# Patient Record
Sex: Female | Born: 1935 | Race: White | Hispanic: No | Smoking: Never smoker
Health system: Southern US, Community
[De-identification: ages and names within clinical notes are randomized; demographics above are authoritative.]

## PROBLEM LIST (undated history)

## (undated) DIAGNOSIS — E785 Hyperlipidemia, unspecified: Secondary | ICD-10-CM

## (undated) DIAGNOSIS — I10 Essential (primary) hypertension: Secondary | ICD-10-CM

## (undated) DIAGNOSIS — C801 Malignant (primary) neoplasm, unspecified: Secondary | ICD-10-CM

## (undated) DIAGNOSIS — Z8781 Personal history of (healed) traumatic fracture: Secondary | ICD-10-CM

## (undated) DIAGNOSIS — J40 Bronchitis, not specified as acute or chronic: Secondary | ICD-10-CM

## (undated) DIAGNOSIS — Z87442 Personal history of urinary calculi: Secondary | ICD-10-CM

## (undated) DIAGNOSIS — J449 Chronic obstructive pulmonary disease, unspecified: Secondary | ICD-10-CM

## (undated) DIAGNOSIS — M1990 Unspecified osteoarthritis, unspecified site: Secondary | ICD-10-CM

## (undated) DIAGNOSIS — Z87898 Personal history of other specified conditions: Secondary | ICD-10-CM

## (undated) DIAGNOSIS — R238 Other skin changes: Secondary | ICD-10-CM

## (undated) DIAGNOSIS — K625 Hemorrhage of anus and rectum: Secondary | ICD-10-CM

## (undated) DIAGNOSIS — R233 Spontaneous ecchymoses: Secondary | ICD-10-CM

## (undated) DIAGNOSIS — I639 Cerebral infarction, unspecified: Secondary | ICD-10-CM

## (undated) DIAGNOSIS — Z8719 Personal history of other diseases of the digestive system: Secondary | ICD-10-CM

## (undated) HISTORY — PX: ABDOMINAL HYSTERECTOMY: SHX81

## (undated) HISTORY — PX: EYE SURGERY: SHX253

## (undated) HISTORY — PX: BACK SURGERY: SHX140

## (undated) HISTORY — PX: CATARACT EXTRACTION, BILATERAL: SHX1313

## (undated) HISTORY — PX: ABDOMINAL SURGERY: SHX537

## (undated) HISTORY — PX: RIB FRACTURE SURGERY: SHX2358

## (undated) HISTORY — PX: SKIN BIOPSY: SHX1

---

## 1995-07-05 HISTORY — PX: OTHER SURGICAL HISTORY: SHX169

## 1997-12-16 ENCOUNTER — Encounter: Admission: RE | Admit: 1997-12-16 | Discharge: 1997-12-16 | Payer: Self-pay | Admitting: Sports Medicine

## 1998-02-24 ENCOUNTER — Encounter: Admission: RE | Admit: 1998-02-24 | Discharge: 1998-02-24 | Payer: Self-pay | Admitting: Sports Medicine

## 1998-03-03 ENCOUNTER — Encounter: Admission: RE | Admit: 1998-03-03 | Discharge: 1998-03-03 | Payer: Self-pay | Admitting: Sports Medicine

## 1998-08-03 ENCOUNTER — Encounter: Admission: RE | Admit: 1998-08-03 | Discharge: 1998-09-17 | Payer: Self-pay | Admitting: Family Medicine

## 1999-02-04 ENCOUNTER — Encounter (INDEPENDENT_AMBULATORY_CARE_PROVIDER_SITE_OTHER): Payer: Self-pay | Admitting: Specialist

## 1999-02-04 ENCOUNTER — Ambulatory Visit (HOSPITAL_COMMUNITY): Admission: RE | Admit: 1999-02-04 | Discharge: 1999-02-04 | Payer: Self-pay | Admitting: Gastroenterology

## 2001-02-12 ENCOUNTER — Encounter: Admission: RE | Admit: 2001-02-12 | Discharge: 2001-02-12 | Payer: Self-pay | Admitting: Family Medicine

## 2001-02-12 ENCOUNTER — Encounter: Payer: Self-pay | Admitting: Family Medicine

## 2001-08-16 ENCOUNTER — Encounter: Payer: Self-pay | Admitting: Neurology

## 2001-08-16 ENCOUNTER — Ambulatory Visit (HOSPITAL_COMMUNITY): Admission: RE | Admit: 2001-08-16 | Discharge: 2001-08-16 | Payer: Self-pay | Admitting: Neurology

## 2001-09-04 ENCOUNTER — Encounter: Admission: RE | Admit: 2001-09-04 | Discharge: 2001-09-04 | Payer: Self-pay | Admitting: Family Medicine

## 2001-09-04 ENCOUNTER — Encounter: Payer: Self-pay | Admitting: Family Medicine

## 2004-06-22 ENCOUNTER — Emergency Department (HOSPITAL_COMMUNITY): Admission: EM | Admit: 2004-06-22 | Discharge: 2004-06-22 | Payer: Self-pay | Admitting: Emergency Medicine

## 2006-07-11 ENCOUNTER — Encounter: Admission: RE | Admit: 2006-07-11 | Discharge: 2006-07-11 | Payer: Self-pay | Admitting: Family Medicine

## 2006-10-28 ENCOUNTER — Inpatient Hospital Stay (HOSPITAL_COMMUNITY): Admission: EM | Admit: 2006-10-28 | Discharge: 2006-11-01 | Payer: Self-pay | Admitting: Emergency Medicine

## 2006-11-14 ENCOUNTER — Encounter: Admission: RE | Admit: 2006-11-14 | Discharge: 2006-11-14 | Payer: Self-pay | Admitting: Family Medicine

## 2007-03-02 ENCOUNTER — Ambulatory Visit (HOSPITAL_COMMUNITY): Admission: RE | Admit: 2007-03-02 | Discharge: 2007-03-02 | Payer: Self-pay | Admitting: Gastroenterology

## 2009-02-19 ENCOUNTER — Emergency Department (HOSPITAL_COMMUNITY): Admission: EM | Admit: 2009-02-19 | Discharge: 2009-02-19 | Payer: Self-pay | Admitting: Emergency Medicine

## 2009-09-20 ENCOUNTER — Emergency Department (HOSPITAL_COMMUNITY): Admission: EM | Admit: 2009-09-20 | Discharge: 2009-09-21 | Payer: Self-pay | Admitting: Emergency Medicine

## 2009-09-20 ENCOUNTER — Emergency Department (HOSPITAL_COMMUNITY): Admission: EM | Admit: 2009-09-20 | Discharge: 2009-09-20 | Payer: Self-pay | Admitting: Emergency Medicine

## 2009-10-05 ENCOUNTER — Emergency Department (HOSPITAL_COMMUNITY): Admission: EM | Admit: 2009-10-05 | Discharge: 2009-10-05 | Payer: Self-pay | Admitting: Emergency Medicine

## 2009-11-04 ENCOUNTER — Encounter (INDEPENDENT_AMBULATORY_CARE_PROVIDER_SITE_OTHER): Payer: Self-pay | Admitting: Family Medicine

## 2009-11-04 ENCOUNTER — Ambulatory Visit: Admission: RE | Admit: 2009-11-04 | Discharge: 2009-11-04 | Payer: Self-pay | Admitting: Family Medicine

## 2009-11-04 ENCOUNTER — Ambulatory Visit: Payer: Self-pay | Admitting: Vascular Surgery

## 2010-07-25 ENCOUNTER — Encounter: Payer: Self-pay | Admitting: Gastroenterology

## 2010-08-06 ENCOUNTER — Emergency Department (HOSPITAL_COMMUNITY): Payer: Medicare Other

## 2010-08-06 ENCOUNTER — Inpatient Hospital Stay (HOSPITAL_COMMUNITY)
Admission: EM | Admit: 2010-08-06 | Discharge: 2010-08-10 | DRG: 392 | Disposition: A | Discharge status: Home Health | Payer: Medicare Other | Attending: Internal Medicine | Admitting: Internal Medicine

## 2010-08-06 DIAGNOSIS — R7402 Elevation of levels of lactic acid dehydrogenase (LDH): Secondary | ICD-10-CM | POA: Diagnosis present

## 2010-08-06 DIAGNOSIS — I10 Essential (primary) hypertension: Secondary | ICD-10-CM | POA: Diagnosis present

## 2010-08-06 DIAGNOSIS — A088 Other specified intestinal infections: Principal | ICD-10-CM | POA: Diagnosis present

## 2010-08-06 DIAGNOSIS — E860 Dehydration: Secondary | ICD-10-CM | POA: Diagnosis present

## 2010-08-06 DIAGNOSIS — E876 Hypokalemia: Secondary | ICD-10-CM | POA: Diagnosis present

## 2010-08-06 DIAGNOSIS — Z7982 Long term (current) use of aspirin: Secondary | ICD-10-CM

## 2010-08-06 DIAGNOSIS — Z8673 Personal history of transient ischemic attack (TIA), and cerebral infarction without residual deficits: Secondary | ICD-10-CM

## 2010-08-06 DIAGNOSIS — R7401 Elevation of levels of liver transaminase levels: Secondary | ICD-10-CM | POA: Diagnosis present

## 2010-08-06 DIAGNOSIS — I959 Hypotension, unspecified: Secondary | ICD-10-CM | POA: Diagnosis present

## 2010-08-06 LAB — CK TOTAL AND CKMB (NOT AT ARMC)
Relative Index: INVALID (ref 0.0–2.5)
Total CK: 24 U/L (ref 7–177)

## 2010-08-06 LAB — COMPREHENSIVE METABOLIC PANEL
Albumin: 3.4 g/dL — ABNORMAL LOW (ref 3.5–5.2)
CO2: 23 mEq/L (ref 19–32)
Calcium: 8.8 mg/dL (ref 8.4–10.5)
Chloride: 101 mEq/L (ref 96–112)
GFR calc Af Amer: >60 mL/min (ref >60)
GFR calc non Af Amer: 54 mL/min — ABNORMAL LOW (ref >60)
Glucose, Bld: 188 mg/dL — ABNORMAL HIGH (ref 70–99)
Potassium: 3.4 mEq/L — ABNORMAL LOW (ref 3.5–5.1)
Sodium: 138 mEq/L (ref 135–145)

## 2010-08-06 LAB — DIFFERENTIAL
Basophils Absolute: 0 10*3/uL (ref 0.0–0.1)
Eosinophils Absolute: 0 10*3/uL (ref 0.0–0.7)
Lymphs Abs: 0.7 10*3/uL (ref 0.7–4.0)
Monocytes Absolute: 0.4 10*3/uL (ref 0.1–1.0)
Neutro Abs: 13.7 10*3/uL — ABNORMAL HIGH (ref 1.7–7.7)

## 2010-08-06 LAB — URINALYSIS, ROUTINE W REFLEX MICROSCOPIC
Bilirubin Urine: NEGATIVE
Hgb urine dipstick: NEGATIVE
Nitrite: NEGATIVE
Urine Glucose, Fasting: NEGATIVE mg/dL
pH: 6.5 (ref 5.0–8.0)

## 2010-08-06 LAB — LIPASE, BLOOD: Lipase: 19 U/L (ref 11–59)

## 2010-08-06 LAB — CBC
Hemoglobin: 15.9 g/dL — ABNORMAL HIGH (ref 12.0–15.0)
MCH: 30.7 pg (ref 26.0–34.0)
Platelets: 273 10*3/uL (ref 150–400)
RDW: 13.7 % (ref 11.5–15.5)

## 2010-08-07 ENCOUNTER — Inpatient Hospital Stay (HOSPITAL_COMMUNITY): Payer: Medicare Other

## 2010-08-07 LAB — CARDIAC PANEL(CRET KIN+CKTOT+MB+TROPI)
CK, MB: 4.8 ng/mL — ABNORMAL HIGH (ref 0.3–4.0)
Relative Index: 0.8 (ref 0.0–2.5)
Total CK: 633 U/L — ABNORMAL HIGH (ref 7–177)
Troponin I: 0.01 ng/mL (ref 0.00–0.06)

## 2010-08-07 LAB — HEMOGLOBIN A1C
Hgb A1c MFr Bld: 5.8 % — ABNORMAL HIGH (ref <5.7)
Mean Plasma Glucose: 120 mg/dL — ABNORMAL HIGH (ref <117)

## 2010-08-07 LAB — COMPREHENSIVE METABOLIC PANEL
AST: 67 U/L — ABNORMAL HIGH (ref 0–37)
Albumin: 2.7 g/dL — ABNORMAL LOW (ref 3.5–5.2)
CO2: 24 mEq/L (ref 19–32)
Calcium: 8.4 mg/dL (ref 8.4–10.5)
Creatinine, Ser: 1.17 mg/dL (ref 0.4–1.2)
GFR calc Af Amer: 53 mL/min — ABNORMAL LOW (ref >60)
GFR calc non Af Amer: 44 mL/min — ABNORMAL LOW (ref >60)
Total Protein: 5.5 g/dL — ABNORMAL LOW (ref 6.0–8.3)

## 2010-08-07 LAB — URINE CULTURE: Culture  Setup Time: 201202031516

## 2010-08-08 LAB — BASIC METABOLIC PANEL
GFR calc Af Amer: 50 mL/min — ABNORMAL LOW (ref >60)
GFR calc non Af Amer: 42 mL/min — ABNORMAL LOW (ref >60)
Potassium: 3.7 mEq/L (ref 3.5–5.1)
Sodium: 143 mEq/L (ref 135–145)

## 2010-08-08 LAB — CBC
HCT: 39.8 % (ref 36.0–46.0)
Platelets: 230 10*3/uL (ref 150–400)
RDW: 14.1 % (ref 11.5–15.5)
WBC: 10.6 10*3/uL — ABNORMAL HIGH (ref 4.0–10.5)

## 2010-08-08 LAB — HEPATITIS PANEL, ACUTE
Hep A IgM: NEGATIVE
Hep B C IgM: NEGATIVE
Hepatitis B Surface Ag: NEGATIVE

## 2010-08-09 DIAGNOSIS — R55 Syncope and collapse: Secondary | ICD-10-CM

## 2010-08-09 LAB — CBC
HCT: 36.8 % (ref 36.0–46.0)
MCV: 87.8 fL (ref 78.0–100.0)
Platelets: 239 10*3/uL (ref 150–400)
RBC: 4.19 MIL/uL (ref 3.87–5.11)
WBC: 7.5 10*3/uL (ref 4.0–10.5)

## 2010-08-09 LAB — DIFFERENTIAL
Lymphocytes Relative: 38 % (ref 12–46)
Lymphs Abs: 2.8 10*3/uL (ref 0.7–4.0)
Neutro Abs: 3.8 10*3/uL (ref 1.7–7.7)
Neutrophils Relative %: 51 % (ref 43–77)

## 2010-08-09 LAB — BASIC METABOLIC PANEL
BUN: 10 mg/dL (ref 6–23)
Chloride: 107 mEq/L (ref 96–112)
Glucose, Bld: 95 mg/dL (ref 70–99)
Potassium: 3.7 mEq/L (ref 3.5–5.1)
Sodium: 143 mEq/L (ref 135–145)

## 2010-08-09 LAB — MAGNESIUM: Magnesium: 1.5 mg/dL (ref 1.5–2.5)

## 2010-08-10 LAB — CBC
HCT: 37.7 % (ref 36.0–46.0)
Hemoglobin: 13.1 g/dL (ref 12.0–15.0)
MCHC: 34.7 g/dL (ref 30.0–36.0)
RBC: 4.35 MIL/uL (ref 3.87–5.11)

## 2010-08-10 LAB — COMPREHENSIVE METABOLIC PANEL
ALT: 28 U/L (ref 0–35)
Alkaline Phosphatase: 53 U/L (ref 39–117)
CO2: 26 mEq/L (ref 19–32)
Calcium: 8.3 mg/dL — ABNORMAL LOW (ref 8.4–10.5)
Chloride: 106 mEq/L (ref 96–112)
GFR calc non Af Amer: >60 mL/min (ref >60)
Glucose, Bld: 109 mg/dL — ABNORMAL HIGH (ref 70–99)
Sodium: 140 mEq/L (ref 135–145)
Total Bilirubin: 1.1 mg/dL (ref 0.3–1.2)

## 2010-08-12 LAB — CULTURE, BLOOD (ROUTINE X 2): Culture: NO GROWTH

## 2010-08-15 NOTE — H&P (Signed)
NAMESHAVONDA, WIEDMAN                  ACCOUNT NO.:  [id number]  MEDICAL RECORD NO.:  [id number]           PATIENT TYPE:  E  LOCATION:  [facility]                         FACILITY:  [facility]  PHYSICIAN:  Michiel Cowboy, MDDATE OF BIRTH:  [birth year]  DATE OF ADMISSION:  08/06/2010 DATE OF DISCHARGE:                             HISTORY & PHYSICAL   ATTENDING PHYSICIAN:  Michiel Cowboy, MD  PRIMARY CARE PROVIDER:  Joycelyn Rua, MD  CHIEF COMPLAINT:  Per ED notes, syncope.  The patient is an 77 year old female, unable to get much of a history from her whatsoever.  She is nonverbal at this point, I do not know what her baseline is.  Her son apparently drops her off in emergency department after episodes of syncope while in the toilet with nausea and vomiting for the past 24 hours.  She also has had some altered mental status and deemed confused.  She had low grade fever.  Family member currently left and I am unable to get hold of them, their phone numbers are disconnected and no one is answering.  PAST MEDICAL HISTORY:  Per emergency department significant for hypertension and stroke.  SOCIAL HISTORY:  Unable to obtain.  FAMILY HISTORY:  Unable to obtain.  REVIEW OF SYSTEMS:  Unable to obtain as the patient is nonverbal.  ALLERGIES:  Per computer, IRON and BANANAS  MEDICATIONS:  Unable to obtain.  Vitals, temperature 100.6 rectally.  Blood pressure 119/48, but couple values down to 103/79, pulse 87, respirations 22, satting 98% on 2 L. The patient appears to be in no acute distress.  Head; nontraumatic, very dry mucous membranes and decreased skin turgor.  The patient is alert but not oriented, not answering any questions, not making eye contact.  Not following commands.  Heart, regular rate and rhythm.  No murmurs appreciated.  Abdomen is soft.  The patient did grimace when I touched her upper abdomen, I am not sure if it is the pain related or not.  Lower extremities  without clubbing, cyanosis or edema.  Neurologically unable to fully evaluate as the patient is not cooperative.  Labs; white blood cell count 14.9, hemoglobin 15.9, sodium 148, potassium 3.4, creatinine 0.99, AST 71, ALT 57, albumin 3.4.  The patient has slightly elevated AST and ALT.  Cardiac enzymes negative.  UA is unremarkable.  Chest x-ray is unremarkable.  Lactic acid is 3.0.  Lipase normal at 19.  EKG showing sinus heart rate of 92.  No ischemic changes could be appreciated.  ASSESSMENT AND PLAN:  This is an 77 year old female with reported nausea, vomiting, possible syncopal episode and low grade fevers and low blood pressure which is currently improved after IV fluids, been admitted for further evaluation. 1. Nausea, vomiting, unclear if she has maybe an abdominal discomfort     and low grade fever.  Low grade fever, nausea and vomiting.  I     wonder if she has some kind of intraabdominal process going on.  No     abdominal imaging was done.  The patient is completely nonverbal     and unable to  provide any history to me though get a KUB and right     upper quadrant ultrasound to evaluate gallbladder and liver.  Given     the low grade fever, white blood cell count and slightly elevated     LFTs, although this could be death secondary to dehydration but I     do not have any other source of infection.  Secondary to low grade     fever, hypertension and presentation of a white blood cell count,     unclear source for [place] now to cover broadly with Zosyn.  Diarrhea.  We will check for C. Diff.  Low potassium replacement secondary to diarrhea.  We will monitor on telemetry.  History of hypertension.  I am not sure what she usually takes at her baseline right now.  We will hold on all antibiotics.  Elevated glucose.  We will check hemoglobin A1c.  Prophylaxis.  Protonix and SCDs.  Altered mental status.  We will get a CT scan of the head to start with.  Code status,  unknown.  Unable to get in contact with any of her family.  Debility.  The patient is very weak.  We will get PT/OT evaluation, prior to that we will get social worker as well to see if she might need placement and also to see if we can somehow getting a hold of her family for her.     Michiel Cowboy, MD     AVD/MEDQ  D:  08/06/2010  T:  08/06/2010  Job:  [id number]  cc:   Joycelyn Rua, M.D.  Electronically Signed by Therisa Doyne MD on 08/14/2010 47:82:95 PM

## 2010-08-15 NOTE — Discharge Summary (Signed)
Stephanie Bruce, Stephanie Bruce                  ACCOUNT NO.:  [id number]  MEDICAL RECORD NO.:  [id number]           PATIENT TYPE:  I  LOCATION:  4730                         FACILITY:  [facility]  PHYSICIAN:  Hartley Barefoot, MD    DATE OF BIRTH:  May 11, 1925  DATE OF ADMISSION:  08/06/2010 DATE OF DISCHARGE:  08/10/2010                              DISCHARGE SUMMARY   DISCHARGE DIAGNOSES: 1. Syncope event likely secondary to hypotension. 2. Hypotension secondary to dehydration. 3. Viral gastroenteritis. 4. Transaminases, resolved.  OTHER PAST MEDICAL HISTORY: 1. Hypertension. 2. History of stroke.  DISCHARGE MEDICATIONS: 1. Protonix 40 mg p.o. daily. 2. Potassium chloride 30 mEq 1 tablet by mouth daily for 5 days. 3. Aspirin 81 mg 1 tablet by mouth daily. 4. Atenolol hydrochlorothiazide was stopped.  DISPOSITION AND FOLLOWUP:  Ms. Stephanie Bruce will need to follow up her primary care physician with 1 or 2 weeks to consider restart her on her bloodpressure medications.  STUDIES PERFORMED: 1. A 2-D echo, systolic function was normal, ejection fraction of 55-     60%.  No wall motion abnormality. 2. Bilateral carotid Doppler, no carotid occlusion. 3. Ultrasound of abdomen, no acute abdominal process. 4. CT of head, small vessel ischemic disease and brain atrophy.  No     acute finding noted.  BRIEF HISTORY OF PRESENT ILLNESS:  This is an 77 year old that presented to the emergency department after having a syncope event.  Per notes, the patient had a syncope while in the toilet when she was having nausea and vomiting.  She has been having nausea and vomiting for the last 24 hours.  She has had also diarrhea.  She has sick contact.  Her sons were sick last week with the same symptoms.  HOSPITAL COURSE: 1. Viral gastroenteritis.  The patient presented with nausea,     vomiting, and diarrhea.  This is likely secondary to viral versus     bacterial gastroenteritis.  She has sick contact, her sons.    Both of them were having diarrhea last week.  The patient was     initially treated with IV fluid and Zosyn.  Zosyn was stopped on     the second day of hospitalization.  White blood cell remained     stable.  Diarrhea has resolved.  She had one loose stool on the     date of discharge.  C. diff and PCR was negative.  Blood cultures     were negative.  Abdominal ultrasound was negative. 2. Transaminases.  This is transient.  Viral hepatitis panel was     negative.  Ultrasound was normal.  Liver function tests normalized     on the day of discharge, AST 37, ALT 28. 3. Syncope likely secondary to orthostatic hypotension Vs      vasovagal.  The patient responded to IV fluid.  A CT head was     negative.  Bilateral carotid artery preliminary result, no ICA     stenosis bilaterally.  A 2-D echo, no aortic stenosis. 4. Hypertension.  We are going to hold her blood pressure medications.  She will need to follow up with her primary care physician to     consider restart her on her blood pressure medications.  The     patient will be discharged off blood pressure medications. 5. Leukocytosis, likely stress-related secondary to viral/bacterial     gastroenteritis.  White blood cells are going down.  The patient     was on 1 day of IV Zosyn.  After stopping antibiotics, the patient     was observed, and white blood cell was continued to go down. 6. On the day of discharge, the patient was in improved condition.     She was tolerating diet.  She was eating and drinking.  Diarrhea     has improved.  She only had one formed loose stool the day of     discharge.  Blood pressure 120/70, sats 96 on room, pulse 72,     respirations 16, temperature 98.3.  White blood cells 7.9,     hemoglobin 13, hematocrit 37, platelet 300.  BUN 3, creatinine     0.77, glucose 109, sodium 140, potassium 3.1.  This will be     repeated prior to discharge, chloride 106, bicarb 26.  The patient     was discharged in  improved condition.  Of note, the patient's sons     want to take her home.  They said they are able to provide the care     that her mother needs.     Hartley Barefoot, MD     BR/MEDQ  D:  08/10/2010  T:  08/11/2010  Job:  [id number]  Electronically Signed by Hartley Barefoot MD on 08/15/2010 10:26:47 PM

## 2010-09-22 LAB — URINE CULTURE: Colony Count: >=100000

## 2010-09-22 LAB — URINALYSIS, ROUTINE W REFLEX MICROSCOPIC
Bilirubin Urine: NEGATIVE
Ketones, ur: NEGATIVE mg/dL
Nitrite: POSITIVE — AB
Protein, ur: NEGATIVE mg/dL
Urobilinogen, UA: 1 mg/dL (ref 0.0–1.0)
pH: 6 (ref 5.0–8.0)

## 2010-09-22 LAB — BASIC METABOLIC PANEL
Chloride: 98 mEq/L (ref 96–112)
Creatinine, Ser: 0.86 mg/dL (ref 0.4–1.2)
GFR calc Af Amer: >60 mL/min (ref >60)
GFR calc non Af Amer: >60 mL/min (ref >60)

## 2010-09-22 LAB — DIFFERENTIAL
Eosinophils Absolute: 0.2 10*3/uL (ref 0.0–0.7)
Lymphocytes Relative: 36 % (ref 12–46)
Lymphs Abs: 3.6 10*3/uL (ref 0.7–4.0)
Monocytes Relative: 9 % (ref 3–12)
Neutro Abs: 5.2 10*3/uL (ref 1.7–7.7)
Neutrophils Relative %: 52 % (ref 43–77)

## 2010-09-22 LAB — CBC
MCV: 92.7 fL (ref 78.0–100.0)
RBC: 4.52 MIL/uL (ref 3.87–5.11)
WBC: 10 10*3/uL (ref 4.0–10.5)

## 2010-09-22 LAB — URINE MICROSCOPIC-ADD ON

## 2010-09-26 LAB — CBC
HCT: 43.4 % (ref 36.0–46.0)
Hemoglobin: 14.7 g/dL (ref 12.0–15.0)
MCHC: 33.9 g/dL (ref 30.0–36.0)
MCV: 93.3 fL (ref 78.0–100.0)
Platelets: 295 10*3/uL (ref 150–400)
RBC: 4.65 MIL/uL (ref 3.87–5.11)
RDW: 14 % (ref 11.5–15.5)
WBC: 13.4 10*3/uL — ABNORMAL HIGH (ref 4.0–10.5)

## 2010-09-26 LAB — BASIC METABOLIC PANEL WITH GFR
CO2: 31 meq/L (ref 19–32)
Chloride: 98 meq/L (ref 96–112)
GFR calc Af Amer: >60 mL/min (ref >60)
Potassium: 3.1 meq/L — ABNORMAL LOW (ref 3.5–5.1)
Sodium: 137 meq/L (ref 135–145)

## 2010-09-26 LAB — DIFFERENTIAL
Basophils Absolute: 0.1 10*3/uL (ref 0.0–0.1)
Basophils Relative: 0 % (ref 0–1)
Eosinophils Absolute: 0.1 10*3/uL (ref 0.0–0.7)
Eosinophils Relative: 1 % (ref 0–5)
Lymphocytes Relative: 32 % (ref 12–46)
Lymphs Abs: 4.2 10*3/uL — ABNORMAL HIGH (ref 0.7–4.0)
Monocytes Absolute: 1.1 K/uL — ABNORMAL HIGH (ref 0.1–1.0)
Monocytes Relative: 8 % (ref 3–12)
Neutro Abs: 7.9 K/uL — ABNORMAL HIGH (ref 1.7–7.7)
Neutrophils Relative %: 59 % (ref 43–77)

## 2010-09-26 LAB — URINALYSIS, ROUTINE W REFLEX MICROSCOPIC
Bilirubin Urine: NEGATIVE
Glucose, UA: NEGATIVE mg/dL
Hgb urine dipstick: NEGATIVE
Ketones, ur: NEGATIVE mg/dL
Nitrite: NEGATIVE
Protein, ur: NEGATIVE mg/dL
Specific Gravity, Urine: 1.008 (ref 1.005–1.030)
Urobilinogen, UA: 1 mg/dL (ref 0.0–1.0)
pH: 7 (ref 5.0–8.0)

## 2010-09-26 LAB — URINE CULTURE
Colony Count: NO GROWTH
Culture: NO GROWTH

## 2010-09-26 LAB — POCT CARDIAC MARKERS
Myoglobin, poc: 75.8 ng/mL (ref 12–200)
Troponin i, poc: <0.05 ng/mL (ref 0.00–0.09)

## 2010-09-26 LAB — BASIC METABOLIC PANEL
BUN: 7 mg/dL (ref 6–23)
Calcium: 9.1 mg/dL (ref 8.4–10.5)
Creatinine, Ser: 0.82 mg/dL (ref 0.4–1.2)
GFR calc non Af Amer: >60 mL/min (ref >60)
Glucose, Bld: 102 mg/dL — ABNORMAL HIGH (ref 70–99)

## 2010-10-09 LAB — CBC
HCT: 42.1 % (ref 36.0–46.0)
MCHC: 34.2 g/dL (ref 30.0–36.0)
MCV: 94.1 fL (ref 78.0–100.0)
Platelets: 261 10*3/uL (ref 150–400)
RDW: 14.1 % (ref 11.5–15.5)
WBC: 11.6 10*3/uL — ABNORMAL HIGH (ref 4.0–10.5)

## 2010-10-09 LAB — URINE CULTURE

## 2010-10-09 LAB — BASIC METABOLIC PANEL
BUN: 17 mg/dL (ref 6–23)
CO2: 31 mEq/L (ref 19–32)
Chloride: 96 mEq/L (ref 96–112)
Creatinine, Ser: 0.85 mg/dL (ref 0.4–1.2)
Glucose, Bld: 111 mg/dL — ABNORMAL HIGH (ref 70–99)
Potassium: 3.4 mEq/L — ABNORMAL LOW (ref 3.5–5.1)

## 2010-10-09 LAB — URINALYSIS, ROUTINE W REFLEX MICROSCOPIC
Nitrite: NEGATIVE
Protein, ur: NEGATIVE mg/dL
Specific Gravity, Urine: 1.021 (ref 1.005–1.030)
Urobilinogen, UA: 0.2 mg/dL (ref 0.0–1.0)

## 2010-10-09 LAB — DIFFERENTIAL
Basophils Relative: 1 % (ref 0–1)
Eosinophils Absolute: 0.1 10*3/uL (ref 0.0–0.7)
Eosinophils Relative: 1 % (ref 0–5)
Lymphs Abs: 4.1 10*3/uL — ABNORMAL HIGH (ref 0.7–4.0)

## 2010-10-09 LAB — PROTIME-INR: Prothrombin Time: 12.5 seconds (ref 11.6–15.2)

## 2010-11-16 NOTE — Discharge Summary (Signed)
NAMENERINE, PULSE                  ACCOUNT NO.:  [id number]   MEDICAL RECORD NO.:  [id number]          PATIENT TYPE:  INP   LOCATION:  6735                         FACILITY:  [facility]   PHYSICIAN:  Andres Shad. Rudean Curt, MD     DATE OF BIRTH:  Nov 28, 1924   DATE OF ADMISSION:  10/28/2006  DATE OF DISCHARGE:  11/01/2006                               DISCHARGE SUMMARY   DISCHARGE DIAGNOSES:  1. Dehydration.  2. Mental status changes.  3. Syncopal episode.  4. Abdominal pain with vomiting.  5. Constipation.   DISCHARGE MEDICATIONS:  1. MiraLax.  2. Atenolol.  3. Evista.  4. Lipitor.  5. Meloxicam.   SUMMARY OF HOSPITALIZATION:  Ms. Buchberger is a 77 year old female with a  past medical history of hypertension who was admitted to the hospital  with a syncopal episode on October 28, 2006.  She had nausea and vomiting  on the day of admission and this was followed by an episode of syncope.  When she came to, she was quite confused and was initially unable to  speak.  She was brought to the emergency department where she gradually  seemed more alert, but was still vomiting.  Initial clinical evaluation  was notable for clinical dehydration.  Radiology studies were performed  including CT scan of the head.  This was remarkable only for atrophy.  PA and lateral views of the chest revealed emphysema with a question of  a nodular density in the left lung base.  CT scan of the abdomen and  pelvis was notable only for diverticulosis and evidence of constipation.   The patient was admitted to the hospital where she received antiemetics,  IV hydration and electrolyte replacement.  She was felt to be quite  cachectic and she needed substantial doses of potassium as well as other  electrolytes  for these to remain within normal limits.  Patient's vital  signs remained stable and she gradually improved in terms of alertness  and orientation over the subsequent few days.  An MRI of the head was  attempted but  the patient was unable to tolerate this exam so it was  decided that an MRI  under sedation was not worth the risk especially in  light of her already altered mental status.   At the time of discharge, the patient's family felt comfortable taking  her home and following up with Dr. Lenise Arena later this week.   FINAL ASSESSMENT AND PLAN:  1. Mental status changes.  This is most likely due to hypovolemia and      electrolyte depletion, this is largely resolved.  The patient      should follow up with her primary care physician within the next      few days and have a repeat set of electrolytes to ensure that they      have remained normal.  2. Vomiting and dehydration.  I have encouraged the patient's sons to      provide a normal diet to her and encourage both caloric and fluid      intake.  3.  Constipation.  I have prescribed the patient MiraLax to be taken as      an outpatient.  She has received a dose in the hospital.  4. Nodular densities seen in the left lung.  Follow up imaging of the      chest with either a repeat PA and lateral chest x-ray or a CT scan      will be useful when the patient has recovered from this acute      illness and is able to participate in such an examination.      Andres Shad. Rudean Curt, MD  Electronically Signed     PML/MEDQ  D:  11/01/2006  T:  11/01/2006  Job:  [id number]   cc:   Joycelyn Rua, M.D.
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
## 2011-05-16 ENCOUNTER — Encounter: Payer: Self-pay | Admitting: *Deleted

## 2011-05-16 ENCOUNTER — Inpatient Hospital Stay (HOSPITAL_COMMUNITY): Payer: Medicare Other

## 2011-05-16 ENCOUNTER — Emergency Department (HOSPITAL_COMMUNITY): Payer: Medicare Other

## 2011-05-16 ENCOUNTER — Other Ambulatory Visit: Payer: Self-pay

## 2011-05-16 ENCOUNTER — Inpatient Hospital Stay (HOSPITAL_COMMUNITY)
Admission: EM | Admit: 2011-05-16 | Discharge: 2011-05-23 | DRG: 871 | Disposition: A | Discharge status: Home Health | Payer: Medicare Other | Attending: Family Medicine | Admitting: Family Medicine

## 2011-05-16 DIAGNOSIS — A419 Sepsis, unspecified organism: Secondary | ICD-10-CM | POA: Diagnosis present

## 2011-05-16 DIAGNOSIS — E785 Hyperlipidemia, unspecified: Secondary | ICD-10-CM | POA: Diagnosis present

## 2011-05-16 DIAGNOSIS — Z8673 Personal history of transient ischemic attack (TIA), and cerebral infarction without residual deficits: Secondary | ICD-10-CM

## 2011-05-16 DIAGNOSIS — R55 Syncope and collapse: Secondary | ICD-10-CM | POA: Diagnosis present

## 2011-05-16 DIAGNOSIS — I959 Hypotension, unspecified: Secondary | ICD-10-CM

## 2011-05-16 DIAGNOSIS — E162 Hypoglycemia, unspecified: Secondary | ICD-10-CM | POA: Diagnosis not present

## 2011-05-16 DIAGNOSIS — R578 Other shock: Secondary | ICD-10-CM | POA: Diagnosis present

## 2011-05-16 DIAGNOSIS — N179 Acute kidney failure, unspecified: Secondary | ICD-10-CM | POA: Diagnosis present

## 2011-05-16 DIAGNOSIS — D72829 Elevated white blood cell count, unspecified: Secondary | ICD-10-CM | POA: Diagnosis present

## 2011-05-16 DIAGNOSIS — J4489 Other specified chronic obstructive pulmonary disease: Secondary | ICD-10-CM | POA: Diagnosis present

## 2011-05-16 DIAGNOSIS — A4151 Sepsis due to Escherichia coli [E. coli]: Principal | ICD-10-CM | POA: Diagnosis present

## 2011-05-16 DIAGNOSIS — J449 Chronic obstructive pulmonary disease, unspecified: Secondary | ICD-10-CM | POA: Diagnosis present

## 2011-05-16 DIAGNOSIS — M129 Arthropathy, unspecified: Secondary | ICD-10-CM | POA: Diagnosis present

## 2011-05-16 DIAGNOSIS — R197 Diarrhea, unspecified: Secondary | ICD-10-CM | POA: Diagnosis not present

## 2011-05-16 DIAGNOSIS — F0390 Unspecified dementia without behavioral disturbance: Secondary | ICD-10-CM | POA: Diagnosis present

## 2011-05-16 DIAGNOSIS — E870 Hyperosmolality and hypernatremia: Secondary | ICD-10-CM | POA: Diagnosis present

## 2011-05-16 DIAGNOSIS — D696 Thrombocytopenia, unspecified: Secondary | ICD-10-CM | POA: Diagnosis not present

## 2011-05-16 DIAGNOSIS — I509 Heart failure, unspecified: Secondary | ICD-10-CM

## 2011-05-16 DIAGNOSIS — I129 Hypertensive chronic kidney disease with stage 1 through stage 4 chronic kidney disease, or unspecified chronic kidney disease: Secondary | ICD-10-CM | POA: Diagnosis present

## 2011-05-16 DIAGNOSIS — E878 Other disorders of electrolyte and fluid balance, not elsewhere classified: Secondary | ICD-10-CM | POA: Diagnosis present

## 2011-05-16 DIAGNOSIS — E861 Hypovolemia: Secondary | ICD-10-CM | POA: Diagnosis present

## 2011-05-16 DIAGNOSIS — N390 Urinary tract infection, site not specified: Secondary | ICD-10-CM | POA: Diagnosis present

## 2011-05-16 DIAGNOSIS — F068 Other specified mental disorders due to known physiological condition: Secondary | ICD-10-CM | POA: Diagnosis present

## 2011-05-16 DIAGNOSIS — N182 Chronic kidney disease, stage 2 (mild): Secondary | ICD-10-CM | POA: Diagnosis present

## 2011-05-16 HISTORY — DX: Personal history of other diseases of the digestive system: Z87.19

## 2011-05-16 HISTORY — DX: Personal history of other specified conditions: Z87.898

## 2011-05-16 HISTORY — DX: Personal history of (healed) traumatic fracture: Z87.81

## 2011-05-16 HISTORY — DX: Cerebral infarction, unspecified: I63.9

## 2011-05-16 HISTORY — DX: Spontaneous ecchymoses: R23.3

## 2011-05-16 HISTORY — DX: Bronchitis, not specified as acute or chronic: J40

## 2011-05-16 HISTORY — DX: Chronic obstructive pulmonary disease, unspecified: J44.9

## 2011-05-16 HISTORY — DX: Hyperlipidemia, unspecified: E78.5

## 2011-05-16 HISTORY — DX: Malignant (primary) neoplasm, unspecified: C80.1

## 2011-05-16 HISTORY — DX: Essential (primary) hypertension: I10

## 2011-05-16 HISTORY — DX: Unspecified osteoarthritis, unspecified site: M19.90

## 2011-05-16 HISTORY — DX: Other skin changes: R23.8

## 2011-05-16 LAB — COMPREHENSIVE METABOLIC PANEL
ALT: 89 U/L — ABNORMAL HIGH (ref 0–35)
Alkaline Phosphatase: 84 U/L (ref 39–117)
BUN: 58 mg/dL — ABNORMAL HIGH (ref 6–23)
BUN: 39 mg/dL — ABNORMAL HIGH (ref 6–23)
CO2: 30 mEq/L (ref 19–32)
CO2: 26 mEq/L (ref 19–32)
Calcium: 7 mg/dL — ABNORMAL LOW (ref 8.4–10.5)
Chloride: >130 mEq/L (ref 96–112)
Creatinine, Ser: 1.24 mg/dL — ABNORMAL HIGH (ref 0.50–1.10)
GFR calc Af Amer: 29 mL/min — ABNORMAL LOW (ref >90)
GFR calc Af Amer: 45 mL/min — ABNORMAL LOW (ref >90)
GFR calc non Af Amer: 39 mL/min — ABNORMAL LOW (ref >90)
Glucose, Bld: 156 mg/dL — ABNORMAL HIGH (ref 70–99)
Glucose, Bld: 101 mg/dL — ABNORMAL HIGH (ref 70–99)
Potassium: 3.2 mEq/L — ABNORMAL LOW (ref 3.5–5.1)
Total Bilirubin: 0.6 mg/dL (ref 0.3–1.2)

## 2011-05-16 LAB — DIFFERENTIAL
Lymphocytes Relative: 32 % (ref 12–46)
Lymphs Abs: 5.3 10*3/uL — ABNORMAL HIGH (ref 0.7–4.0)
Monocytes Relative: 7 % (ref 3–12)
Neutro Abs: 9.9 10*3/uL — ABNORMAL HIGH (ref 1.7–7.7)
Neutrophils Relative %: 60 % (ref 43–77)

## 2011-05-16 LAB — CARDIAC PANEL(CRET KIN+CKTOT+MB+TROPI)
CK, MB: 4.7 ng/mL — ABNORMAL HIGH (ref 0.3–4.0)
Relative Index: 0.6 (ref 0.0–2.5)
Total CK: 849 U/L — ABNORMAL HIGH (ref 7–177)

## 2011-05-16 LAB — URINE MICROSCOPIC-ADD ON

## 2011-05-16 LAB — URINALYSIS, ROUTINE W REFLEX MICROSCOPIC
Bilirubin Urine: NEGATIVE
Hgb urine dipstick: NEGATIVE
Ketones, ur: NEGATIVE mg/dL
Nitrite: POSITIVE — AB
Urobilinogen, UA: 0.2 mg/dL (ref 0.0–1.0)

## 2011-05-16 LAB — CARBOXYHEMOGLOBIN
Carboxyhemoglobin: 1.3 % (ref 0.5–1.5)
O2 Saturation: 52.7 %
Total hemoglobin: 12.9 g/dL (ref 12.5–16.0)

## 2011-05-16 LAB — CBC
Hemoglobin: 16 g/dL — ABNORMAL HIGH (ref 12.0–15.0)
MCH: 31.7 pg (ref 26.0–34.0)
RBC: 5.05 MIL/uL (ref 3.87–5.11)
WBC: 16.6 10*3/uL — ABNORMAL HIGH (ref 4.0–10.5)

## 2011-05-16 LAB — LACTIC ACID, PLASMA
Lactic Acid, Venous: 3.4 mmol/L — ABNORMAL HIGH (ref 0.5–2.2)
Lactic Acid, Venous: 2.1 mmol/L (ref 0.5–2.2)

## 2011-05-16 LAB — PROCALCITONIN: Procalcitonin: 0.22 ng/mL

## 2011-05-16 MED ORDER — DEXTROSE 5 % IV SOLN
2.0000 g | Freq: Once | INTRAVENOUS | Status: DC
  Filled 2011-05-16: qty 2

## 2011-05-16 MED ORDER — NOREPINEPHRINE BITARTRATE 1 MG/ML IJ SOLN
2.0000 ug/min | INTRAVENOUS | Status: DC | PRN
  Administered 2011-05-16: 5 ug/min via INTRAVENOUS
  Administered 2011-05-17 (×2): 10 ug/min via INTRAVENOUS
  Administered 2011-05-17: 20 ug/min via INTRAVENOUS
  Filled 2011-05-16 (×4): qty 4

## 2011-05-16 MED ORDER — SODIUM CHLORIDE 0.9 % IV SOLN: INTRAVENOUS | Status: DC

## 2011-05-16 MED ORDER — SODIUM CHLORIDE 0.9 % IV BOLUS (SEPSIS): 25.0000 mL/kg | Freq: Once | INTRAVENOUS | Status: DC

## 2011-05-16 MED ORDER — DEXTROSE 5 % IV SOLN: 1.0000 g | INTRAVENOUS | Status: DC

## 2011-05-16 MED ORDER — HEPARIN SODIUM (PORCINE) 5000 UNIT/ML IJ SOLN
5000.0000 [IU] | Freq: Three times a day (TID) | INTRAMUSCULAR | Status: DC
  Administered 2011-05-16 – 2011-05-23 (×22): 5000 [IU] via SUBCUTANEOUS
  Filled 2011-05-16 (×28): qty 1

## 2011-05-16 MED ORDER — DEXTROSE 5 % IV SOLN
1.0000 g | Freq: Once | INTRAVENOUS | Status: DC
  Filled 2011-05-16: qty 10

## 2011-05-16 MED ORDER — CEFTRIAXONE SODIUM 2 G IJ SOLR
2.0000 g | Freq: Once | INTRAMUSCULAR | Status: DC
  Filled 2011-05-16: qty 2

## 2011-05-16 MED ORDER — SODIUM CHLORIDE 0.9 % IV SOLN
999.0000 mL | Freq: Once | INTRAVENOUS | Status: AC
  Administered 2011-05-16 (×3): 999 mL via INTRAVENOUS

## 2011-05-16 MED ORDER — SODIUM CHLORIDE 0.9 % IV BOLUS (SEPSIS)
1000.0000 mL | Freq: Once | INTRAVENOUS | Status: AC
  Administered 2011-05-16: 1000 mL via INTRAVENOUS

## 2011-05-16 MED ORDER — DEXTROSE 5 % IV SOLN
1.0000 g | INTRAVENOUS | Status: DC
  Administered 2011-05-17 – 2011-05-18 (×2): 1 g via INTRAVENOUS
  Filled 2011-05-16 (×3): qty 10

## 2011-05-16 MED ORDER — DOBUTAMINE IN D5W 4-5 MG/ML-% IV SOLN
2.5000 ug/kg/min | INTRAVENOUS | Status: DC | PRN
  Administered 2011-05-16: 2.5 ug/kg/min via INTRAVENOUS
  Administered 2011-05-17: 5 ug/kg/min via INTRAVENOUS
  Filled 2011-05-16: qty 250

## 2011-05-16 MED ORDER — PANTOPRAZOLE SODIUM 40 MG IV SOLR
40.0000 mg | Freq: Every day | INTRAVENOUS | Status: DC
  Administered 2011-05-16 – 2011-05-19 (×4): 40 mg via INTRAVENOUS
  Filled 2011-05-16 (×5): qty 40

## 2011-05-16 MED ORDER — DOPAMINE-DEXTROSE 3.2-5 MG/ML-% IV SOLN
2.0000 ug/kg/min | INTRAVENOUS | Status: DC
  Administered 2011-05-16: 2 ug/kg/min via INTRAVENOUS
  Filled 2011-05-16: qty 250

## 2011-05-16 MED ORDER — SODIUM CHLORIDE 0.9 % IV BOLUS (SEPSIS)
500.0000 mL | INTRAVENOUS | Status: DC | PRN
  Administered 2011-05-16 – 2011-05-17 (×2): 500 mL via INTRAVENOUS

## 2011-05-16 MED ORDER — DEXTROSE 5 % IV SOLN
1.0000 g | Freq: Once | INTRAVENOUS | Status: AC
  Administered 2011-05-16: 1 g via INTRAVENOUS
  Filled 2011-05-16: qty 10

## 2011-05-16 MED ORDER — SODIUM CHLORIDE 0.9 % IV BOLUS (SEPSIS)
500.0000 mL | Freq: Once | INTRAVENOUS | Status: DC
  Administered 2011-05-16: 500 mL via INTRAVENOUS

## 2011-05-16 NOTE — ED Notes (Signed)
Unable to get labs. Clare Gandy, RN aware

## 2011-05-16 NOTE — ED Notes (Signed)
MD at bedside. 

## 2011-05-16 NOTE — ED Notes (Signed)
Report given to receiving RN....transported to [id number] with tele/RN escort.Marland KitchenMarland KitchenMarland Kitchen

## 2011-05-16 NOTE — ED Notes (Signed)
Family informed of admit plans...extensive  Teaching ie need to maintain daily hydration to avoid renel failure, and teaching concerning nutrition and protein intake .Marland Kitchen..sons indicate poor understanding of care or nutritional support.Marland KitchenMarland Kitchen
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
## 2011-05-16 NOTE — ED Notes (Signed)
MD notified about low blood pressure.

## 2011-05-16 NOTE — ED Notes (Signed)
Family at bedside. 

## 2011-05-16 NOTE — Progress Notes (Signed)
eLink Physician-Brief Progress Note Patient Name: Stephanie Bruce DOB: [birth year] MRN: [id number]  Date of Service  05/16/2011   HPI/Events of Note  BP reminas low inspite of 3L fluids No PICC  Nurse available per bedside RN   eICU Interventions  Will inititiate dopamine via pIV   Intervention Category Major Interventions: Sepsis - evaluation and management  ALVA,RAKESH V. 05/16/2011, 6:45 PM

## 2011-05-16 NOTE — ED Notes (Signed)
Vital signs stable. 

## 2011-05-16 NOTE — ED Notes (Signed)
Pt is allergic to all sedatives and sleeping medications.

## 2011-05-16 NOTE — Progress Notes (Signed)
ANTIBIOTIC CONSULT NOTE - INITIAL  Pharmacy Consult for Rocephin Indication: rule out sepsis/UTI  Allergies  Allergen Reactions  . Ambien     Pt becomes comatose.  . Banana   . Iron Other (See Comments)    Pt turn black    Patient Measurements: Weight: 116 lb 2.9 oz (52.7 kg)  Vital Signs: Temp: 97.7 F (36.5 C) (11/12 1600) Temp src: Axillary (11/12 1600) BP: 91/39 mmHg (11/12 1808) Pulse Rate: 86  (11/12 1808) Intake/Output from previous day:   Intake/Output from this shift: Total I/O In: 2000 [I.V.:1000; Other:1000] Out: -   Labs:  Basename 05/16/11 1100  WBC 16.6*  HGB 16.0*  PLT 170  LABCREA --  CREATININE 1.76*   CrCl is unknown because there is no height on file for the current visit. No results found for this basename: VANCOTROUGH:2,VANCOPEAK:2,VANCORANDOM:2,GENTTROUGH:2,GENTPEAK:2,GENTRANDOM:2,TOBRATROUGH:2,TOBRAPEAK:2,TOBRARND:2,AMIKACINPEAK:2,AMIKACINTROU:2,AMIKACIN:2, in the last 72 hours   Microbiology: No results found for this or any previous visit (from the past 720 hour(s)).  Medical History: Past Medical History  Diagnosis Date  . Stroke   . Arthritis   . Hypertension   . Cancer     skin   . History of fractured rib   . Bronchitis     History of  . COPD (chronic obstructive pulmonary disease)   . Personal history of kidney stones   . H/O: GI bleed   . Constipation   . History of syncope   . Bruises easily   . Hyperlipidemia       Assessment: 77 yo F admitted with syncope. Pharmacy to dose Rocephin. Rocephin 2g x 1 dose ordered, subsequent doses per pharmacy.    Plan:  Continue with Rocephin 1g IV q24h. No adjustment for renal fx is necessary. Will sign off, reconsult if necessary.  Gwen Her 05/16/2011,6:31 PM

## 2011-05-16 NOTE — Progress Notes (Signed)
B/P remains low. Discussed risks, benefits, and alternatives for PICC placement. Son stated he would rather wait to make a decision when patient is more stable. Current B/P 62/42. ICU RN notified of family's decision. Asked her to page IV Team once B/P was more stable for a sustained period. Primary RN asked to contact MD with this information. Christeen Douglas RN [facility]

## 2011-05-16 NOTE — ED Notes (Signed)
[id number]<AV> Expected date:05/16/11<BR> Expected time: 7:36 AM<BR> Means of arrival:Ambulance<BR> Comments:<BR> Failure to thrive

## 2011-05-16 NOTE — ED Notes (Signed)
Patient denies pain and is resting comfortably.  

## 2011-05-16 NOTE — Procedures (Signed)
Central Venous Catheter Insertion Procedure Note Stephanie Bruce [id number] 06-27-1925  Procedure: Insertion of Central Venous Catheter Indications: Drug and/or fluid administration  Procedure Details Consent: Risks of procedure as well as the alternatives and risks of each were explained to the (patient/caregiver).  Consent for procedure obtained. Time Out: Verified patient identification, verified procedure, site/side was marked, verified correct patient position, special equipment/implants available, medications/allergies/relevent history reviewed, required imaging and test results available.  Performed  Maximum sterile technique was used including antiseptics, cap, gloves, gown, hand hygiene, mask and sheet. Skin prep: Chlorhexidine; local anesthetic administered A antimicrobial bonded/coated triple lumen catheter was placed in the right internal jugular vein using the Seldinger technique.  Evaluation Blood flow good Complications: No apparent complications Patient did tolerate procedure well. Chest X-ray ordered to verify placement.  CXR: pending.  Stephanie Bruce 05/16/2011, 9:27 PM  2

## 2011-05-16 NOTE — ED Provider Notes (Addendum)
History     CSN: [id number] Arrival date & time: 05/16/2011  8:04 AM   First MD Initiated Contact with Patient 05/16/11 [phone]      Chief Complaint  Patient presents with  . Weakness    Called to home for failure to thrive.    (Consider location/radiation/quality/duration/timing/severity/associated sxs/prior treatment) HPI The patient presents with family members who provide much of the history of present illness. Patient has a history of dementia and prior stroke, and at baseline is minimally interactive, with no capacity to have a "normal" conversation. The son notes that gradually over the past week the patient has become aggressively less interactive. At baseline she can walk with assistance, this has become impossible over the past day in particular. Additionally the patient has anorexia, decreased stool output. No reported fever, no emesis. Past Medical History  Diagnosis Date  . Stroke   . Arthritis   . Blood transfusion   . Cancer     skin   . Hypertension     Past Surgical History  Procedure Date  . Abdominal surgery   . Breast surgery   . Eye surgery     bilat cataract  . Abdominal hysterectomy   . Skin biopsy     Family History  Problem Relation Age of Onset  . Cancer Mother   . Diabetes Mother   . Hyperlipidemia Mother   . Hypertension Mother   . Cancer Father   . Hypertension Father     History  Substance Use Topics  . Smoking status: Never Smoker   . Smokeless tobacco: Former Neurosurgeon  . Alcohol Use: No    OB History    Grav Para Term Preterm Abortions TAB SAB Ect Mult Living                  Review of Systems  Unable to perform ROS: Dementia    Allergies  Ambien  Home Medications  No current outpatient prescriptions on file.  BP 120/60  Pulse 90  Resp 20  SpO2 98%  Physical Exam  Nursing note and vitals reviewed. Constitutional: She appears listless. She appears ill. No distress.  HENT:  Head: Normocephalic and atraumatic.    Eyes: Conjunctivae are normal.  Neck: Neck supple. No tracheal tenderness present. Carotid bruit is not present. No edema and no erythema present.  Cardiovascular: Normal rate and regular rhythm.   Pulmonary/Chest: No accessory muscle usage or stridor. No respiratory distress. She has decreased breath sounds.  Abdominal: Soft. Normal appearance. There is no rebound.  Musculoskeletal:       No gross deficits, though the patient does not cooperate with exam.  Lymphadenopathy:    She has no cervical adenopathy.  Neurological: She appears listless.       No focal deficits, though the patient is listless and does not participate in any focal neurologic evaluation.  Skin: Skin is warm and dry. No abrasion and no rash noted. She is not diaphoretic. No pallor.    ED Course  Procedures (including critical care time)   Labs Reviewed  CULTURE, BLOOD (SINGLE)  CULTURE, BLOOD (ROUTINE X 2)  CULTURE, BLOOD (ROUTINE X 2)  CBC  DIFFERENTIAL  COMPREHENSIVE METABOLIC PANEL  LACTIC ACID, PLASMA  URINALYSIS, ROUTINE W REFLEX MICROSCOPIC  URINE CULTURE   No results found.   No diagnosis found.   Date: 05/16/2011  Rate: 82  Rhythm: normal sinus rhythm  QRS Axis: normal  Intervals: normal  ST/T Wave abnormalities: nonspecific T wave changes  Conduction Disutrbances:none  Narrative Interpretation:   Old EKG Reviewed: none available  CRITICAL CARE Performed by: Gerhard Munch CRITICAL CARE Performed by: Gerhard Munch   Total critical care time: 40  Critical care time was exclusive of separately billable procedures and treating other patients.  Critical care was necessary to treat or prevent imminent or life-threatening deterioration.  Critical care was time spent personally by me on the following activities: development of treatment plan with patient and/or surrogate as well as nursing, discussions with consultants, evaluation of patient's response to treatment, examination of  patient, obtaining history from patient or surrogate, ordering and performing treatments and interventions, ordering and review of laboratory studies, ordering and review of radiographic studies, pulse oximetry and re-evaluation of patient's condition.    MDM  Elderly female with history of prior CVA and minimal baseline interactive he now presents for 1 week of decreased activity, and reported he should. On exam the patient is nondistressed though she is notably listless. The patient's initial vital signs were all stable however initial labs were all remarkable for profound dehydration, with resultant hypernatremia hyperchloremia. Patient's urinalysis demonstrated infection. Further the patient was a new renal failure. All of these ablations were discussed with family who reiterate that she is a full code patient, and the patient continued to get fluid resuscitation. Although the patient's lab evaluation was remarkable, throughout other evidence of distress. She was admitted to the step down unit, under the hospitalists for further evaluation and management.        Gerhard Munch, MD 05/16/11 [id number]  Gerhard Munch, MD 07/21/11 [phone]

## 2011-05-16 NOTE — ED Notes (Signed)
Per EMS, called to home for weakness and lethargy.  Pt lives with son who states that she has not been able to ambulate around the house starting yesterday and has gotten worse overnight.  This am, he cannot get her to eat or drink.

## 2011-05-16 NOTE — ED Notes (Signed)
Patient is resting comfortably. 

## 2011-05-17 LAB — BASIC METABOLIC PANEL
BUN: 27 mg/dL — ABNORMAL HIGH (ref 6–23)
BUN: 20 mg/dL (ref 6–23)
BUN: 16 mg/dL (ref 6–23)
CO2: 15 mEq/L — ABNORMAL LOW (ref 19–32)
Calcium: 5.7 mg/dL — CL (ref 8.4–10.5)
Calcium: 5.8 mg/dL — CL (ref 8.4–10.5)
Chloride: >130 mEq/L (ref 96–112)
Creatinine, Ser: 1.19 mg/dL — ABNORMAL HIGH (ref 0.50–1.10)
Creatinine, Ser: 1.1 mg/dL (ref 0.50–1.10)
Creatinine, Ser: 1.1 mg/dL (ref 0.50–1.10)
GFR calc Af Amer: 47 mL/min — ABNORMAL LOW (ref >90)
GFR calc Af Amer: 52 mL/min — ABNORMAL LOW (ref >90)
GFR calc non Af Amer: 45 mL/min — ABNORMAL LOW (ref >90)
GFR calc non Af Amer: 45 mL/min — ABNORMAL LOW (ref >90)
Glucose, Bld: 268 mg/dL — ABNORMAL HIGH (ref 70–99)
Glucose, Bld: 162 mg/dL — ABNORMAL HIGH (ref 70–99)
Glucose, Bld: 132 mg/dL — ABNORMAL HIGH (ref 70–99)
Potassium: 3 mEq/L — ABNORMAL LOW (ref 3.5–5.1)
Sodium: 150 mEq/L — ABNORMAL HIGH (ref 135–145)

## 2011-05-17 LAB — CARBOXYHEMOGLOBIN
Carboxyhemoglobin: 1.1 % (ref 0.5–1.5)
Carboxyhemoglobin: 1 % (ref 0.5–1.5)
Carboxyhemoglobin: 1 % (ref 0.5–1.5)
Carboxyhemoglobin: 1.2 % (ref 0.5–1.5)
Methemoglobin: 1.4 % (ref 0.0–1.5)
Methemoglobin: 1.5 % (ref 0.0–1.5)
Methemoglobin: 1.7 % — ABNORMAL HIGH (ref 0.0–1.5)
Methemoglobin: 1.7 % — ABNORMAL HIGH (ref 0.0–1.5)
Methemoglobin: 1.8 % — ABNORMAL HIGH (ref 0.0–1.5)
Total hemoglobin: 10.4 g/dL — ABNORMAL LOW (ref 12.5–16.0)
Total hemoglobin: 11.6 g/dL — ABNORMAL LOW (ref 12.5–16.0)
Total hemoglobin: 12.7 g/dL (ref 12.5–16.0)

## 2011-05-17 LAB — TYPE AND SCREEN
ABO/RH(D): A POS
Antibody Screen: NEGATIVE

## 2011-05-17 LAB — CORTISOL-AM, BLOOD: Cortisol - AM: 15.6 ug/dL (ref 4.3–22.4)

## 2011-05-17 LAB — HEPATIC FUNCTION PANEL
ALT: 55 U/L — ABNORMAL HIGH (ref 0–35)
AST: 62 U/L — ABNORMAL HIGH (ref 0–37)
Bilirubin, Direct: 0.1 mg/dL (ref 0.0–0.3)
Total Bilirubin: 0.4 mg/dL (ref 0.3–1.2)

## 2011-05-17 LAB — PROCALCITONIN: Procalcitonin: 0.2 ng/mL

## 2011-05-17 LAB — CORTISOL: Cortisol, Plasma: 19.2 ug/dL

## 2011-05-17 MED ORDER — DOBUTAMINE IN D5W 4-5 MG/ML-% IV SOLN
5.0000 ug/kg/min | INTRAVENOUS | Status: DC
  Administered 2011-05-18: 5 ug/kg/min via INTRAVENOUS
  Filled 2011-05-17 (×2): qty 250

## 2011-05-17 MED ORDER — DEXTROSE-NACL 5-0.45 % IV SOLN
INTRAVENOUS | Status: DC
  Administered 2011-05-17 (×4): via INTRAVENOUS

## 2011-05-17 MED ORDER — NOREPINEPHRINE BITARTRATE 1 MG/ML IJ SOLN: 2.0000 ug/min | INTRAVENOUS | Status: DC | PRN

## 2011-05-17 MED ORDER — POTASSIUM CHLORIDE 10 MEQ/50ML IV SOLN
10.0000 meq | INTRAVENOUS | Status: AC
  Administered 2011-05-17 (×7): 10 meq via INTRAVENOUS
  Filled 2011-05-17 (×6): qty 50

## 2011-05-17 MED ORDER — SODIUM CHLORIDE 0.45 % IV BOLUS
500.0000 mL | Freq: Once | INTRAVENOUS | Status: AC
  Administered 2011-05-17: 1000 mL via INTRAVENOUS

## 2011-05-17 MED ORDER — POTASSIUM CHLORIDE 10 MEQ/100ML IV SOLN
INTRAVENOUS | Status: AC
  Filled 2011-05-17: qty 100

## 2011-05-17 MED ORDER — SODIUM PHOSPHATE 3 MMOLE/ML IV SOLN
20.0000 mmol | Freq: Once | INTRAVENOUS | Status: AC
  Administered 2011-05-17: 20 mmol via INTRAVENOUS
  Filled 2011-05-17: qty 6.67

## 2011-05-17 MED ORDER — SODIUM CHLORIDE 0.9 % IV BOLUS (SEPSIS): 500.0000 mL | INTRAVENOUS | Status: DC | PRN

## 2011-05-17 MED ORDER — SODIUM CHLORIDE 0.45 % IV SOLN
Freq: Once | INTRAVENOUS | Status: AC
  Administered 2011-05-17: 13:00:00 via INTRAVENOUS

## 2011-05-17 MED ORDER — POTASSIUM CHLORIDE 10 MEQ/50ML IV SOLN
10.0000 meq | INTRAVENOUS | Status: AC
  Administered 2011-05-17 (×4): 10 meq via INTRAVENOUS
  Filled 2011-05-17 (×4): qty 50

## 2011-05-17 MED ORDER — MAGNESIUM SULFATE IN D5W 10-5 MG/ML-% IV SOLN
1.0000 g | Freq: Once | INTRAVENOUS | Status: DC
  Filled 2011-05-17: qty 100

## 2011-05-17 MED ORDER — SODIUM CHLORIDE 0.9 % IV BOLUS (SEPSIS)
1000.0000 mL | Freq: Once | INTRAVENOUS | Status: AC
  Administered 2011-05-17: 1000 mL via INTRAVENOUS

## 2011-05-17 MED ORDER — MAGNESIUM SULFATE 50 % IJ SOLN
INTRAVENOUS | Status: AC
  Administered 2011-05-17: 17:00:00 via INTRAVENOUS
  Filled 2011-05-17: qty 100

## 2011-05-17 MED ORDER — SODIUM CHLORIDE 0.45 % IV SOLN
INTRAVENOUS | Status: DC
  Administered 2011-05-17 – 2011-05-20 (×5): via INTRAVENOUS

## 2011-05-17 MED ORDER — LIDOCAINE HCL 1 % IJ SOLN
INTRAMUSCULAR | Status: AC
  Administered 2011-05-17: 1 mL via INTRAMUSCULAR
  Filled 2011-05-17: qty 20

## 2011-05-17 NOTE — Progress Notes (Signed)
Patient name: Stephanie Bruce Medical record number: [id number] Date of birth: [birth year] Age: 77 y.o. Gender: female PCP: No primary provider on file.  Date: 05/17/2011 Reason for Consult: Shock, presumed sepsis Referring Physician: Feliz-Ortiz  Patient Description  77 year old woman with a history of dementia, prior CVA and hypertension. Admitted with shock due to hypovolemia and presumed severe sepsis.    Location Start Stop  Rt ij cvl  05/16/11     Culture Date Result  Blood x2 05/16/11   Urine 05/16/11    Antibiotic Indication Start Stop  ceftriaxone UTI 05/16/11    GI Prophylaxis DVT Prophylaxis  Protonix 05/16/11 Heparin 05/16/11   Protocols     Consultants       Date Events  05/16/11 CT scan >> no acute changes   HPI: 77 year old woman with a history of dementia, prior CVA and hypertension. She was admitted in February 2012 with syncope related to profound dehydration. She has been living at home with her son who is her primary caregiver. He reports that she has had progressive lethargy, poor by mouth intake and weakness for several weeks. She has also had decreasing urinary output and has been constipated. Of note her son mentions that she has required disimpaction and that she may have a rectal mass. On the date of hospital admission she experienced a syncopal episode that lasted approximately 2 minutes. In the emergency department she was noted to have significant hypotension, hypernatremia, and altered mental status. A urinalysis revealed a possible urinary tract infection. Care medicine was consulted for shock and presumed severe sepsis  Temp:  [95.9 F (35.5 C)-100.4 F (38 C)] 99.9 F (37.7 C) (11/13 1000) Pulse Rate:  [38-122] 90  (11/13 1000) Resp:  [13-24] 20  (11/13 1000) BP: (67-159)/(29-90) 159/90 mmHg (11/13 0030) SpO2:  [88 %-100 %] 98 % (11/13 1000) Weight:  [116 lb 2.9 oz (52.7 kg)] 116 lb 2.9 oz (52.7 kg) (11/12 1800) CVP:  [4 mmHg-12 mmHg] 4  mmHg  Intake/Output Summary (Last 24 hours) at 05/17/11 1003 Last data filed at 05/17/11 0800  Gross per 24 hour  Intake 8521.12 ml  Output    545 ml  Net 7976.12 ml   Physical exam .exa      LAB RESULT Lab Results  Component Value Date   CREATININE 1.19* 05/17/2011   BUN 27* 05/17/2011   NA 161* 05/17/2011   K 2.6* 05/17/2011   CL >130* 05/17/2011   CO2 17* 05/17/2011   Lab Results  Component Value Date   WBC 11.1* 05/17/2011   HGB 11.1* 05/17/2011   HCT 34.8* 05/17/2011   MCV 99.1 05/17/2011   PLT 124* 05/17/2011   Lab Results  Component Value Date   ALT 55* 05/17/2011   AST 62* 05/17/2011   ALKPHOS 59 05/17/2011   BILITOT 0.4 05/17/2011   Lab Results  Component Value Date   INR 1.36 05/16/2011   INR 0.9 02/19/2009    Radiology Dg Chest 2 View  05/16/2011  *RADIOLOGY REPORT*  Clinical Data: Weakness.  Altered mental status.  Dehydration.  CHEST - 2 VIEW  Comparison: 08/06/2010  Findings: Biapical pleuroparenchymal scarring noted.  Sternal deformity from remote fracture noted.  Cardiac and mediastinal contours appear unremarkable.  No pleural effusion is identified.  There is a suggestion of minimal left lower lobe subsegmental atelectasis or scarring.  IMPRESSION:  1.  Minimal left lower lobe subsegmental atelectasis or scarring. 2.  Deformity from remote sternal fracture. 3.  Mild biapical pleuroparenchymal scarring.  Original Report Authenticated By: Dellia Cloud, M.D.   Ct Head Wo Contrast  05/16/2011  *RADIOLOGY REPORT*  Clinical Data: Weakness and lethargy  CT HEAD WITHOUT CONTRAST  Technique:  Contiguous axial images were obtained from the base of the skull through the vertex without contrast.  Comparison: 08/06/2010  Findings: There is diffuse patchy low density throughout the subcortical and periventricular white matter consistent with chronic small vessel ischemic change.  There is prominence of the sulci and ventricles consistent with brain  atrophy.  There is no evidence for acute brain infarct, hemorrhage or mass.  Paranasal sinuses and mastoid air cells are clear.  IMPRESSION:  1.  Small vessel ischemic disease and brain atrophy.  Original Report Authenticated By: Rosealee Albee, M.D.   Dg Chest Port 1 View  05/16/2011  *RADIOLOGY REPORT*  Clinical Data: Central line placement.  PORTABLE CHEST - 1 VIEW  Comparison: Chest radiograph performed earlier today at 08:40 a.m.  Findings: The patient's right IJ line is seen ending about the mid SVC.  The lungs are well-aerated.  Vascular congestion is noted, without significant pulmonary edema.  The left costophrenic angle is incompletely imaged on this study.  However, there is no evidence of focal opacification, pleural effusion or pneumothorax. Mild density at the right lung apex is likely outside the patient.  The cardiomediastinal silhouette is within normal limits.  No acute osseous abnormalities are seen.  IMPRESSION:  1.  Right IJ line seen ending about the mid SVC. 2.  Vascular congestion noted, without significant pulmonary edema.  Original Report Authenticated By: Tonia Ghent, M.D.     Assessment and Plan  1. Sepsis associated hypotension. Suspect that she has hypovolemic shock as evidenced by her profound hypernatremia, likely chronic and slowly progressive. Consider also sepsis mediated shock given her positive urinalysis. Lactic acid is 3.4 and blood pressure appears to be responding to IV fluids. Her urine output has picked up since hydration has been initiated. I discussed her status with her sons at the bedside. Further I explained to them that there were some interventions that their mother would not tolerate well including intubation and mechanical ventilation, CPR, or other aggressive interventions. I recommended that her care not be escalated to include mechanical ventilation or CPR. It is not clear to me that they fully understand this recommendation. They do clearly  understand that their mother would benefit most from aggressive care that was minimally invasive. At the same time they indicate that they want her to have "everything done, every chance to possibly survive". For now we have agreed to try to support her aggressively with IV fluids, antibiotics, other noninvasive interventions including PICC line placement. If she were to decline significantly they would still want her to receive all aggressive care including central line placement, pressors, mechanical ventilation, CPR. -cvl - Change to CVP to guide IV fluid administration. The volume resuscitation will need to be tailored to the patient's hypernatremia. We will check frequent labs to ensure that we are not changing her sodium too quickly. - Ceftriaxone as ordered per possible urinary tract - follow lactic acid and procalcitonin for improvement as she is resuscitated  2.  UTI (lower urinary tract infection) - Ceftriaxone as ordered, urinary culture is pending and blood cultures will be drawn  3.  Hypovolemic hypernatremia  4.  Renal failure, acute. Due to hypoperfusion and HTN. - Follow serum creatinine with volume administration. She is not a good candidate for hemodialysis should she fail to improve her  renal function.  MINOR,WILLIAM S 05/17/2011, 10:03 AM    Attending Addendum:  I have seen the patient, discussed the issues, test results and plans with S. Minor. I agree with the Assessment and Plans as outlined above.  Azalie Harbeck S. 05/17/2011 4:55 PM

## 2011-05-17 NOTE — Progress Notes (Signed)
Ur review completed  

## 2011-05-17 NOTE — Progress Notes (Signed)
Name: SOLITA MACADAM MRN: [id number] DOB: Aug 10, 1924  ELECTRONIC ICU PHYSICIAN NOTE  Problem:  Hypotensive with last cvp 6, hypernatremic also Intervention:  Continue vol expansion with NS bolus but change the maint IV to D5 one half ns at 300 cc per hour to start to correct hypernatremia  Sandrea Hughs 05/17/2011, 12:06 AM

## 2011-05-17 NOTE — Procedures (Signed)
Arterial Catheter Insertion Procedure Note Stephanie Bruce [id number] 14-Oct-1924    Procedure: Insertion of Arterial Catheter  Indications: Blood pressure monitoring  Procedure Details Consent: Risks of procedure as well as the alternatives and risks of each were explained to the (patient/caregiver).  Consent for procedure obtained. Time Out: Verified patient identification, verified procedure, site/side was marked, verified correct patient position, special equipment/implants available, medications/allergies/relevent history reviewed, required imaging and test results available.  Performed  Maximum sterile technique was used including cap, gloves, gown, hand hygiene, mask and sheet. Skin prep: Chlorhexidine; local anesthetic administered 20 gauge catheter was inserted into left radial artery using the Seldinger technique.  Evaluation Blood flow good; BP tracing good. Complications: No apparent complications.   Berton Bon 05/17/2011

## 2011-05-17 NOTE — Plan of Care (Signed)
Problem: Phase III Progression Outcomes Goal: Discharge plan remains appropriate-arrangements made Outcome: Progressing Spoke with Stephanie Bruce, social work, concerning home health care needs. Advanced dementia, both brothers take care of Mom at home. Skin intact but crusty over face and inner thighs darkened with dirt. Son, Stephanie Bruce states he and brother bath mom and cannot afford further help due to fixed incomes.

## 2011-05-17 NOTE — Progress Notes (Signed)
CSW received referral from RN, Pam that Pt may need additional care other than home with brothers. Per RN, Pt has had HH services and brothers are working hard to care for Pt. at home. CSW will complete full assessment with family and assist with dispo as appropriate.  Vennie Homans, [place] 05/17/2011 11:19 AM [phone]

## 2011-05-17 NOTE — Progress Notes (Signed)
eLink Physician-Brief Progress Note Patient Name: Stephanie Bruce DOB: Jun 23, 1925 MRN: [id number]  Date of Service  05/17/2011   HPI/Events of Note   Hypokalemia persists  eICU Interventions  See orders for more KCL IV   Intervention Category Major Interventions: Electrolyte abnormality - evaluation and management  Shan Levans 05/17/2011, 7:32 PM

## 2011-05-17 NOTE — Progress Notes (Addendum)
eLink Physician-Brief Progress Note Patient Name: Stephanie Bruce DOB: 25-Sep-1924 MRN: [id number]  Date of Service  05/17/2011   HPI/Events of Note   Mg and PO4 both low  eICU Interventions  See orders for repletion IV Mg and PO4   Intervention Category Major Interventions: Electrolyte abnormality - evaluation and management  Shan Levans 05/17/2011, 4:03 PM

## 2011-05-17 NOTE — Progress Notes (Signed)
CRITICAL VALUE ALERT  Critical value received:  Ca+5.8  Date of notification:  05/17/2011  Time of notification:  1930  Critical value read back:yes  Nurse who received alert:  Paulla Fore  MD notified (1st page):  Dr. Delford Field  Time of first page:  1930  MD notified (2nd page):  Time of second page:  Responding MD:  Dr. Delford Field  Time MD responded:  [phone]

## 2011-05-17 NOTE — Progress Notes (Signed)
CRITICAL VALUE ALERT  Critical value received:  K+ 2.6, Ca+6.0  Date of notification:  05/17/2011  Time of notification:  0616  Critical value read back:yes  Nurse who received alert:  M. Rashada Klontz  MD notified (1st page):  Dr. Sherene Sires Time of first page:  0619 MD notified (2nd page):  Time of second page:  Responding MD:    Time MD responded:

## 2011-05-18 ENCOUNTER — Inpatient Hospital Stay (HOSPITAL_COMMUNITY): Payer: Medicare Other

## 2011-05-18 LAB — BASIC METABOLIC PANEL
BUN: 13 mg/dL (ref 6–23)
BUN: 9 mg/dL (ref 6–23)
CO2: 17 mEq/L — ABNORMAL LOW (ref 19–32)
Calcium: 5.7 mg/dL — CL (ref 8.4–10.5)
Chloride: 119 mEq/L — ABNORMAL HIGH (ref 96–112)
Creatinine, Ser: 1.11 mg/dL — ABNORMAL HIGH (ref 0.50–1.10)
Creatinine, Ser: 1.04 mg/dL (ref 0.50–1.10)
GFR calc Af Amer: 51 mL/min — ABNORMAL LOW (ref >90)
GFR calc Af Amer: 51 mL/min — ABNORMAL LOW (ref >90)
GFR calc Af Amer: 56 mL/min — ABNORMAL LOW (ref >90)
GFR calc non Af Amer: 44 mL/min — ABNORMAL LOW (ref >90)
GFR calc non Af Amer: 48 mL/min — ABNORMAL LOW (ref >90)
Glucose, Bld: 81 mg/dL (ref 70–99)
Potassium: 3.4 mEq/L — ABNORMAL LOW (ref 3.5–5.1)

## 2011-05-18 LAB — MAGNESIUM: Magnesium: 1.4 mg/dL — ABNORMAL LOW (ref 1.5–2.5)

## 2011-05-18 LAB — CBC
HCT: 32.4 % — ABNORMAL LOW (ref 36.0–46.0)
Hemoglobin: 11.1 g/dL — ABNORMAL LOW (ref 12.0–15.0)
Hemoglobin: 10.6 g/dL — ABNORMAL LOW (ref 12.0–15.0)
MCH: 31.6 pg (ref 26.0–34.0)
MCH: 30.9 pg (ref 26.0–34.0)
MCHC: 31.9 g/dL (ref 30.0–36.0)
MCHC: 32.7 g/dL (ref 30.0–36.0)
RBC: 3.43 MIL/uL — ABNORMAL LOW (ref 3.87–5.11)

## 2011-05-18 LAB — URINE CULTURE: Culture  Setup Time: 201211121717

## 2011-05-18 LAB — PHOSPHORUS: Phosphorus: 2.1 mg/dL — ABNORMAL LOW (ref 2.3–4.6)

## 2011-05-18 MED ORDER — MAGNESIUM SULFATE 40 MG/ML IJ SOLN
2.0000 g | Freq: Once | INTRAMUSCULAR | Status: AC
  Administered 2011-05-18: 2 g via INTRAVENOUS
  Filled 2011-05-18: qty 50

## 2011-05-18 MED ORDER — MAGNESIUM SULFATE BOLUS VIA INFUSION: 2.0000 g | Freq: Once | INTRAVENOUS | Status: DC

## 2011-05-18 MED ORDER — POTASSIUM PHOSPHATE DIBASIC 3 MMOLE/ML IV SOLN
20.0000 mmol | Freq: Once | INTRAVENOUS | Status: AC
  Administered 2011-05-18: 20 mmol via INTRAVENOUS
  Filled 2011-05-18: qty 6.67

## 2011-05-18 MED ORDER — POTASSIUM CHLORIDE 10 MEQ/50ML IV SOLN
10.0000 meq | INTRAVENOUS | Status: AC
  Administered 2011-05-18 – 2011-05-19 (×4): 10 meq via INTRAVENOUS
  Filled 2011-05-18 (×5): qty 50

## 2011-05-18 MED ORDER — POTASSIUM CHLORIDE 10 MEQ/100ML IV SOLN
INTRAVENOUS | Status: AC
  Filled 2011-05-18: qty 500

## 2011-05-18 NOTE — Progress Notes (Signed)
eLink Physician-Brief Progress Note Patient Name: Stephanie Bruce DOB: [birth year] MRN: [id number]  Date of Service  05/18/2011   HPI/Events of Note  Dobutamine d/c'd.  K low>>>repleted  eICU Interventions  KCL iv ordered    Intervention Category Major Interventions: Electrolyte abnormality - evaluation and management Minor Interventions: Routine modifications to care plan (e.g. PRN medications for pain, fever)  Shan Levans 05/18/2011, 9:02 PM

## 2011-05-18 NOTE — Progress Notes (Signed)
eLink Physician-Brief Progress Note Patient Name: Stephanie Bruce DOB: 1924/11/07 MRN: [id number]  Date of Service  05/18/2011   HPI/Events of Note   Hypomagnesemia  eICU Interventions  Magnesium sulfate 2 gm IV infusion to correct deficiency   Intervention Category Major Interventions: Electrolyte abnormality - evaluation and management  DETERDING,ELIZABETH 05/18/2011, 6:37 AM

## 2011-05-18 NOTE — Progress Notes (Signed)
eLink Physician-Brief Progress Note Patient Name: Stephanie Bruce DOB: 05-Dec-1924 MRN: [id number]  Date of Service  05/18/2011   HPI/Events of Note   Hypophosphatemia and hypokalemia  eICU Interventions  KPhos 20 mmol IV ordered for replacement   Intervention Category Major Interventions: Electrolyte abnormality - evaluation and management  Phu Record 05/18/2011, 6:35 AM

## 2011-05-18 NOTE — Progress Notes (Signed)
CSW completed assessment and placed in shadow chart in wall. Pt lives with two sons and they would like her to return home. CSW spoke with son, Fayrene Fearing and left him info about advanced dementia in room. He stated he has learned more about her condition in the last few days than in the last few years. CSW will assist with resources and support. CSW let RN CM know Pt may need HH at d/c as apppropriate.  Vennie Homans, [place] 05/18/2011 12:07 PM #[phone]

## 2011-05-18 NOTE — Progress Notes (Signed)
Patient name: Stephanie Bruce Medical record number: [id number] Date of birth: 06/19/1925 Age: 77 y.o. Gender: female PCP: No primary provider on file.  Date: 05/18/2011 Reason for Consult: Shock, presumed sepsis Referring Physician: Feliz-Ortiz  Patient Description  77 year old woman with a history of dementia, prior CVA and hypertension. Admitted with shock due to hypovolemia and presumed severe sepsis.    Location Start Stop  Rt ij cvl  05/16/11     Culture Date Result  Blood x2 05/16/11   Urine 05/16/11    Antibiotic Indication Start Stop  ceftriaxone UTI 05/16/11    GI Prophylaxis DVT Prophylaxis  Protonix 05/16/11 Heparin 05/16/11   Protocols     Consultants       Date Events  05/16/11 CT scan >> no acute changes   HPI: 77 year old woman with a history of dementia, prior CVA and hypertension. She was admitted in February 2012 with syncope related to profound dehydration. She has been living at home with her son who is her primary caregiver. He reports that she has had progressive lethargy, poor by mouth intake and weakness for several weeks. She has also had decreasing urinary output and has been constipated. Of note her son mentions that she has required disimpaction and that she may have a rectal mass. On the date of hospital admission she experienced a syncopal episode that lasted approximately 2 minutes. In the emergency department she was noted to have significant hypotension, hypernatremia, and altered mental status. A urinalysis revealed a possible urinary tract infection. Care medicine was consulted for shock and presumed severe sepsis  Temp:  [99.3 F (37.4 C)-100.9 F (38.3 C)] 100.2 F (37.9 C) (11/14 0800) Pulse Rate:  [84-117] 93  (11/14 0800) Resp:  [19-28] 23  (11/14 0800) SpO2:  [96 %-100 %] 98 % (11/14 0800) Weight:  [143 lb 4.8 oz (65 kg)] 143 lb 4.8 oz (65 kg) (11/14 0630) CVP:  [4 mmHg-9 mmHg] 9 mmHg  Intake/Output Summary (Last 24 hours) at 05/18/11  0954 Last data filed at 05/18/11 0700  Gross per 24 hour  Intake 6082.33 ml  Output    892 ml  Net 5190.33 ml   Physical exam General: EWF NAD at rest. Demented and confused HEENT: Rt I J cvl Chest: decreased bs bases Cor: HSD : ABD: + bs NWG:NFAO      LAB RESULT Lab Results  Component Value Date   CREATININE 1.11* 05/18/2011   BUN 13 05/18/2011   NA 146* 05/18/2011   K 3.4* 05/18/2011   CL 119* 05/18/2011   CO2 17* 05/18/2011   Lab Results  Component Value Date   WBC 9.3 05/18/2011   HGB 10.6* 05/18/2011   HCT 32.4* 05/18/2011   MCV 94.5 05/18/2011   PLT 115* 05/18/2011   Lab Results  Component Value Date   ALT 55* 05/17/2011   AST 62* 05/17/2011   ALKPHOS 59 05/17/2011   BILITOT 0.4 05/17/2011   Lab Results  Component Value Date   INR 1.36 05/16/2011   INR 0.9 02/19/2009    Radiology Dg Chest Port 1 View  05/18/2011  *RADIOLOGY REPORT*  Clinical Data: Shortness of breath  PORTABLE CHEST - 1 VIEW  Comparison: Portable chest x-ray of 05/16/2011  Findings: The minimal pulmonary vascular congestion has improved. Heart size is stable.  The right IJ central venous line is unchanged in position.  IMPRESSION: Improvement in mild pulmonary vascular congestion.  Original Report Authenticated By: Juline Patch, M.D.   Dg Chest Port 1  View  05/16/2011  *RADIOLOGY REPORT*  Clinical Data: Central line placement.  PORTABLE CHEST - 1 VIEW  Comparison: Chest radiograph performed earlier today at 08:40 a.m.  Findings: The patient's right IJ line is seen ending about the mid SVC.  The lungs are well-aerated.  Vascular congestion is noted, without significant pulmonary edema.  The left costophrenic angle is incompletely imaged on this study.  However, there is no evidence of focal opacification, pleural effusion or pneumothorax. Mild density at the right lung apex is likely outside the patient.  The cardiomediastinal silhouette is within normal limits.  No acute osseous  abnormalities are seen.  IMPRESSION:  1.  Right IJ line seen ending about the mid SVC. 2.  Vascular congestion noted, without significant pulmonary edema.  Original Report Authenticated By: Tonia Ghent, M.D.     Assessment and Plan  1. Sepsis associated hypotension. Suspect that she has hypovolemic shock as evidenced by her profound hypernatremia, likely chronic and slowly progressive. Consider also sepsis mediated shock given her positive urinalysis. Lactic acid is 3.4 and blood pressure appears to be responding to IV fluids. Her urine output has picked up since hydration has been initiated. I discussed her status with her sons at the bedside. Further I explained to them that there were some interventions that their mother would not tolerate well including intubation and mechanical ventilation, CPR, or other aggressive interventions. I recommended that her care not be escalated to include mechanical ventilation or CPR. It is not clear to me that they fully understand this recommendation. They do clearly understand that their mother would benefit most from aggressive care that was minimally invasive. At the same time they indicate that they want her to have "everything done, every chance to possibly survive". For now we have agreed to try to support her aggressively with IV fluids, antibiotics, other noninvasive interventions including PICC line placement. If she were to decline significantly they would still want her to receive all aggressive care including central line placement, pressors, mechanical ventilation, CPR. -cvl - Change to CVP to guide IV fluid administration. The volume resuscitation will need to be tailored to the patient's hypernatremia. We will check frequent labs to ensure that we are not changing her sodium too quickly. - Ceftriaxone as ordered per possible urinary tract - follow lactic acid and procalcitonin for improvement as she is resuscitated -DC DOBUTAMINE 11/14  2.  UTI  (lower urinary tract infection) - Ceftriaxone as ordered, urinary culture is pending and blood cultures pending  3.  Hypovolemic hypernatremia(resolved 11/14)  4.  Renal failure, acute.(RESOLVED 11/14) Due to hypoperfusion and HTN. - Follow serum creatinine with volume administration.    MINOR,WILLIAM S 05/18/2011, 9:54 AM    Attending Addendum:  I have seen the patient, discussed the issues, test results and plans with S. Minor. I agree with the Assessment and Plans as outlined above.  BYRUM,ROBERT S. 05/18/2011 4:37 PM

## 2011-05-18 NOTE — Progress Notes (Signed)
MSW consult this am. [name]. Has demential and is total care with repositioning, adls such as bathing, transferring, turning, and feeding. Both work outside the home but care for mother without any outside assistance. Son Stephanie Bruce has mild MR, but very attentive to mother. Bath given this am by RN and NT, VSS, CVP 9.

## 2011-05-19 LAB — BASIC METABOLIC PANEL
BUN: 8 mg/dL (ref 6–23)
CO2: 19 mEq/L (ref 19–32)
Calcium: 6.7 mg/dL — ABNORMAL LOW (ref 8.4–10.5)
Creatinine, Ser: 1.03 mg/dL (ref 0.50–1.10)

## 2011-05-19 LAB — CBC
MCH: 30.8 pg (ref 26.0–34.0)
MCHC: 33.2 g/dL (ref 30.0–36.0)
RDW: 15 % (ref 11.5–15.5)

## 2011-05-19 LAB — GLUCOSE, CAPILLARY: Glucose-Capillary: 76 mg/dL (ref 70–99)

## 2011-05-19 LAB — MAGNESIUM: Magnesium: 1.8 mg/dL (ref 1.5–2.5)

## 2011-05-19 LAB — PHOSPHORUS: Phosphorus: 1.9 mg/dL — ABNORMAL LOW (ref 2.3–4.6)

## 2011-05-19 MED ORDER — ACETAMINOPHEN 650 MG RE SUPP
RECTAL | Status: AC
  Administered 2011-05-19: 650 mg via RECTAL
  Filled 2011-05-19: qty 1

## 2011-05-19 MED ORDER — CIPROFLOXACIN IN D5W 400 MG/200ML IV SOLN
400.0000 mg | Freq: Two times a day (BID) | INTRAVENOUS | Status: DC
  Administered 2011-05-19 – 2011-05-21 (×4): 400 mg via INTRAVENOUS
  Filled 2011-05-19 (×6): qty 200

## 2011-05-19 MED ORDER — ACETAMINOPHEN 650 MG RE SUPP
650.0000 mg | Freq: Four times a day (QID) | RECTAL | Status: DC | PRN
  Administered 2011-05-19 – 2011-05-20 (×2): 650 mg via RECTAL
  Filled 2011-05-19: qty 1

## 2011-05-19 NOTE — Progress Notes (Signed)
13:00 pm MSW consulted to assist family with care of mother at home. Both sons work full time and have cared for mother at home without assistance. CCM in to eval pt. Ate 80% pureed diet fed by RN 16:00 CVP monitor and artline d/c'd per order. 18:00 Ate 80% diet fed by RN. VSS. 2L n/c O2 while eating, on room air for most of the day with sats in high 90s. IV abx given.

## 2011-05-19 NOTE — Progress Notes (Signed)
eLink Physician-Brief Progress Note Patient Name: Stephanie Bruce DOB: 01-08-1925 MRN: [id number]  Date of Service  05/19/2011   HPI/Events of Note   Fever  eICU Interventions  Tylenol 650 mg rectally prn temp greater than 100.28F     Victor Langenbach 05/19/2011, 12:42 AM

## 2011-05-19 NOTE — Progress Notes (Signed)
CRITICAL VALUE ALERT  Critical value received: Calcium 6.4    Date of notification:  05/18/11 Time of notification:  2106  Critical value read back: Yes  Nurse who received alert: Mort Sawyers RN   MD notified (1st page):  Bea Laura Deterding MD Time of first page: 2106 MD notified (2nd page):  Time of second page:  Responding MD: Deterding MD. Time MD responded: 2110

## 2011-05-19 NOTE — Progress Notes (Signed)
Patient name: Stephanie Bruce Medical record number: [id number] Date of birth: 06-Nov-1924 Age: 77 y.o. Gender: female PCP: No primary provider on file.  Date: 05/19/2011 Reason for Consult: Shock, presumed sepsis Referring Physician: Feliz-Ortiz  Patient Description  77 year old woman with a history of dementia, prior CVA and hypertension. Admitted with shock due to hypovolemia and presumed severe sepsis.    Location Start Stop  Rt ij cvl  05/16/11     Culture Date Result  Blood x2 05/16/11   Urine 05/16/11  +ecoli pan sensitive   Antibiotic Indication Start Stop  ceftriaxone UTI 05/16/11 11/15  cipro  UTI 11/15    GI Prophylaxis DVT Prophylaxis  Protonix 05/16/11 Heparin 05/16/11   Protocols     Consultants       Date Events  05/16/11 CT scan >> no acute changes    Temp:  [99.9 F (37.7 C)-101.5 F (38.6 C)] 100.6 F (38.1 C) (11/15 1000) Pulse Rate:  [80-101] 82  (11/15 1000) Resp:  [19-26] 21  (11/15 1000) BP: (108-112)/(49) 112/49 mmHg (11/15 0800) SpO2:  [93 %-100 %] 97 % (11/15 1000) Weight:  [147 lb 7.8 oz (66.9 kg)] 147 lb 7.8 oz (66.9 kg) (11/15 0400) CVP:  [4 mmHg-9 mmHg] 9 mmHg  Intake/Output Summary (Last 24 hours) at 05/19/11 1010 Last data filed at 05/19/11 1000  Gross per 24 hour  Intake   1954 ml  Output   1850 ml  Net    104 ml   Physical exam General: EWF NAD at rest. Demented and confused HEENT: Rt I J cvl Chest: decreased bs bases Cor: HSD : ABD: + bs [id number]      LAB RESULT Lab Results  Component Value Date   CREATININE 1.03 05/19/2011   BUN 8 05/19/2011   NA 141 05/19/2011   K 3.5 05/19/2011   CL 113* 05/19/2011   CO2 19 05/19/2011   Lab Results  Component Value Date   WBC 7.1 05/19/2011   HGB 9.7* 05/19/2011   HCT 29.2* 05/19/2011   MCV 92.7 05/19/2011   PLT 113* 05/19/2011   Lab Results  Component Value Date   ALT 55* 05/17/2011   AST 62* 05/17/2011   ALKPHOS 59 05/17/2011   BILITOT 0.4 05/17/2011   Lab  Results  Component Value Date   INR 1.36 05/16/2011   INR 0.9 02/19/2009    Radiology Dg Chest Port 1 View  05/18/2011  *RADIOLOGY REPORT*  Clinical Data: Shortness of breath  PORTABLE CHEST - 1 VIEW  Comparison: Portable chest x-ray of 05/16/2011  Findings: The minimal pulmonary vascular congestion has improved. Heart size is stable.  The right IJ central venous line is unchanged in position.  IMPRESSION: Improvement in mild pulmonary vascular congestion.  Original Report Authenticated By: Juline Patch, M.D.     Assessment and Plan  1. Sepsis associated hypotension. Suspect that she has hypovolemic shock as evidenced by her profound hypernatremia, likely chronic and slowly progressive. Consider also sepsis mediated shock given her positive urinalysis. Lactic acid is 3.4 and blood pressure appears to be responding to IV fluids. Her urine output has picked up since hydration has been initiated. I discussed her status with her sons at the bedside. Further I explained to them that there were some interventions that their mother would not tolerate well including intubation and mechanical ventilation, CPR, or other aggressive interventions. I recommended that her care not be escalated to include mechanical ventilation or CPR. It is not clear to me that  they fully understand this recommendation. They do clearly understand that their mother would benefit most from aggressive care that was minimally invasive. At the same time they indicate that they want her to have "everything done, every chance to possibly survive". For now we have agreed to try to support her aggressively with IV fluids, antibiotics, other noninvasive interventions including 75 line placement. If she were to decline significantly they would still want her to receive all aggressive care including central line placement, pressors, mechanical ventilation, CPR. -cvl - Change to CVP to guide IV fluid administration. The volume resuscitation  will need to be tailored to the patient's hypernatremia. We will check frequent labs to ensure that we are not changing her sodium too quickly. - Ceftriaxone as ordered per possible urinary tract - follow lactic acid and procalcitonin for improvement as she is resuscitated -DC DOBUTAMINE 11/14  2.  UTI (lower urinary tract infection) - Ceftriaxone as ordered, urinary culture is pending and blood cultures pending++ecoli 11/15 change to cipro  3.  Hypovolemic hypernatremia(resolved 11/14)  4.  Renal failure, acute.(RESOLVED 11/14) Due to hypoperfusion and HTN. - Follow serum creatinine with volume administration.  5. Disposition A. Change to cipro B. tx to sdu C. Transition back to triad 11/16    MINOR,WILLIAM S 05/19/2011, 10:10 AM    Attending Addendum:  I have seen the patient, discussed the issues, test results and plans with S. Minor. I agree with the Assessment and Plans as outlined above.  MINOR,WILLIAM S 05/19/2011 10:10 AM

## 2011-05-20 ENCOUNTER — Inpatient Hospital Stay (HOSPITAL_COMMUNITY): Payer: Medicare Other

## 2011-05-20 LAB — CBC
HCT: 28.5 % — ABNORMAL LOW (ref 36.0–46.0)
Hemoglobin: 9.9 g/dL — ABNORMAL LOW (ref 12.0–15.0)
MCH: 31.6 pg (ref 26.0–34.0)
MCHC: 34.7 g/dL (ref 30.0–36.0)
MCV: 91.1 fL (ref 78.0–100.0)
RBC: 3.13 MIL/uL — ABNORMAL LOW (ref 3.87–5.11)

## 2011-05-20 LAB — GLUCOSE, CAPILLARY
Glucose-Capillary: 166 mg/dL — ABNORMAL HIGH (ref 70–99)
Glucose-Capillary: 135 mg/dL — ABNORMAL HIGH (ref 70–99)

## 2011-05-20 LAB — BASIC METABOLIC PANEL
BUN: 6 mg/dL (ref 6–23)
Chloride: 112 mEq/L (ref 96–112)
Creatinine, Ser: 0.93 mg/dL (ref 0.50–1.10)
GFR calc Af Amer: 64 mL/min — ABNORMAL LOW (ref >90)
Glucose, Bld: 100 mg/dL — ABNORMAL HIGH (ref 70–99)
Potassium: 3.4 mEq/L — ABNORMAL LOW (ref 3.5–5.1)

## 2011-05-20 MED ORDER — PANTOPRAZOLE SODIUM 40 MG PO TBEC
40.0000 mg | DELAYED_RELEASE_TABLET | Freq: Every day | ORAL | Status: DC
  Administered 2011-05-20 – 2011-05-23 (×4): 40 mg via ORAL
  Filled 2011-05-20 (×4): qty 1

## 2011-05-20 MED ORDER — DEXTROSE 50 % IV SOLN: 25.0000 mL | Freq: Once | INTRAVENOUS | Status: AC | PRN

## 2011-05-20 MED ORDER — GLUCOSE 40 % PO GEL: 1.0000 | ORAL | Status: DC | PRN

## 2011-05-20 MED ORDER — INSULIN ASPART 100 UNIT/ML ~~LOC~~ SOLN
0.0000 [IU] | Freq: Three times a day (TID) | SUBCUTANEOUS | Status: DC
  Administered 2011-05-20: 2 [IU] via SUBCUTANEOUS
  Administered 2011-05-21 – 2011-05-22 (×3): 1 [IU] via SUBCUTANEOUS
  Filled 2011-05-20: qty 3

## 2011-05-20 NOTE — Progress Notes (Signed)
Sat up in chair from 0645 to 1600 today. On room air, ate 100% of pureed breakfast and lunch fed by RN. VSS, off dobutamine for >48h, central line saline locked. Foley d/c'd. Assisted back to bed max assistance x 1-2. Weight bearing, able to "shuffle" feet for a few steps to bed. Sons attentive at bedside and take care of mother at home, but both work full time and could benefit from nursing assistance at home. MD in to eval pt and meet sons.

## 2011-05-20 NOTE — Progress Notes (Signed)
[facility] NOTE  This 77 year old female was admitted 05/16/2011 with a diagnosis of possible sepsis from urinary origin with hypotension and was found down by her family. The patient required IV resuscitation as well as IV pressors until 2 days ago and was noted to have a lactate on admission of 3.4. She is growing out Escherichia coli over 100,000 colony-forming units and this is currently being treated with a #4 of Rocephin and ciprofloxacin.      Patient Details:    Stephanie Bruce is an 77 y.o. female. Doing well today. Seems at baseline. Is demented and has possible stage 4-5 dementia based on exam. Is able to answer simple commands but does not make any sense when verbalizing. Both sons are at bedside.  Nursing reports the patient has had a good couple of days. They also note that she is a lot more conversant and low more alert. She has been able to get up and out of bed with nursing and has ambulated somewhat.  She is 100% of her food today and seems to be doing better.   Lines, Airways, Drains: CVC Triple Lumen 05/16/11 Right Internal jugular (Active)  Site Assessment Clean;Dry;Intact 05/20/2011 12:00 PM  Proximal Lumen Status Infusing 05/20/2011 12:00 PM  Medial Infusing 05/20/2011 12:00 PM  Distal Lumen Status Infusing 05/20/2011 12:00 PM  Line Care Connections checked and tightened 05/20/2011 12:00 PM  Dressing Type Transparent 05/20/2011 12:00 PM  Dressing Status Clean;Dry;Intact 05/20/2011 12:00 PM  Dressing Intervention Dressing reinforced 05/19/2011  8:00 AM  Dressing Change Due 05/25/11 05/20/2011 12:00 PM  Indication for Insertion or Continuance of Line Vasoactive infusions 05/18/2011  8:00 PM     Urethral Catheter Temperature probe 14 Fr. (Active)  Site Assessment Clean;Intact 05/20/2011 12:00 PM  Collection Container Standard drainage bag 05/20/2011 12:00 PM  Securement Method Securing device (Describe) 05/20/2011 12:00 PM  Indication for Insertion or  Continuance of Catheter Urinary output monitoring 05/20/2011 12:00 PM  Output (mL) 100 mL 05/20/2011 12:00 PM    Anti-infectives:  Anti-infectives     Start     Dose/Rate Route Frequency Ordered Stop   05/19/11 1830   ciprofloxacin (CIPRO) IVPB 400 mg        400 mg 200 mL/hr over 60 Minutes Intravenous Every 12 hours 05/19/11 1026     05/17/11 1800   cefTRIAXone (ROCEPHIN) 1 g in dextrose 5 % 50 mL IVPB  Status:  Discontinued        1 g 100 mL/hr over 30 Minutes Intravenous Every 24 hours 05/16/11 1830 05/19/11 1026   05/17/11 0000   cefTRIAXone (ROCEPHIN) 1 g in dextrose 5 % 50 mL IVPB  Status:  Discontinued        1 g 100 mL/hr over 30 Minutes Intravenous Every 24 hours 05/16/11 1829 05/16/11 1836   05/16/11 1845   cefTRIAXone (ROCEPHIN) 1 g in dextrose 5 % 50 mL IVPB  Status:  Discontinued        1 g 100 mL/hr over 30 Minutes Intravenous  Once 05/16/11 1804 05/16/11 1824   05/16/11 1845   cefTRIAXone (ROCEPHIN) 2 g in dextrose 5 % 50 mL IVPB  Status:  Discontinued        2 g 100 mL/hr over 30 Minutes Intravenous  Once 05/16/11 1803 05/16/11 1833   05/16/11 1845   cefTRIAXone (ROCEPHIN) 1 g in dextrose 5 % 50 mL IVPB  Status:  Discontinued        1 g 100 mL/hr over 30  Minutes Intravenous  Once 05/16/11 1833 05/19/11 1032   05/16/11 1830   cefTRIAXone (ROCEPHIN) 2 g in dextrose 5 % 50 mL IVPB  Status:  Discontinued        2 g 100 mL/hr over 30 Minutes Intravenous  Once 05/16/11 1802 05/16/11 1825   05/16/11 1245   cefTRIAXone (ROCEPHIN) 1 g in dextrose 5 % 50 mL IVPB        1 g 100 mL/hr over 30 Minutes Intravenous  Once 05/16/11 1239 05/16/11 1418          Microbiology: Results for orders placed during the hospital encounter of 05/16/11  URINE CULTURE     Status: Normal   Collection Time   05/16/11 11:44 AM      Component Value Range Status Comment   Specimen Description URINE, CATHETERIZED   Final    Special Requests NONE   Final    Setup Time [id number]    Final    Colony Count >=100,000 COLONIES/ML   Final    Culture ESCHERICHIA COLI   Final    Report Status 05/18/2011 FINAL   Final    Organism ID, Bacteria ESCHERICHIA COLI   Final   MRSA PCR SCREENING     Status: Normal   Collection Time   05/16/11  5:26 PM      Component Value Range Status Comment   MRSA by PCR NEGATIVE  NEGATIVE  Final   CULTURE, BLOOD (ROUTINE X 2)     Status: Normal (Preliminary result)   Collection Time   05/16/11 10:11 PM      Component Value Range Status Comment   Specimen Description BLOOD LEFT ARM   Final    Special Requests BOTTLES DRAWN AEROBIC ONLY    Final    Setup Time [id number]   Final    Culture     Final    Value:        BLOOD CULTURE RECEIVED NO GROWTH TO DATE CULTURE WILL BE HELD FOR 5 DAYS BEFORE ISSUING A FINAL NEGATIVE REPORT   Report Status PENDING   Incomplete   CULTURE, BLOOD (ROUTINE X 2)     Status: Normal (Preliminary result)   Collection Time   05/16/11 10:47 PM      Component Value Range Status Comment   Specimen Description BLOOD  LINE   Final    Special Requests BOTTLES DRAWN AEROBIC AND ANAEROBIC    Final    Setup Time [id number]   Final    Culture     Final    Value:        BLOOD CULTURE RECEIVED NO GROWTH TO DATE CULTURE WILL BE HELD FOR 5 DAYS BEFORE ISSUING A FINAL NEGATIVE REPORT   Report Status PENDING   Incomplete     Best Practice/Protocols:  VTE Prophylaxis: Heparin (SQ) Sepsis (date>>>11.12.12-TODAY)  Events:   Studies: Dg Chest 2 View  05/16/2011  *RADIOLOGY REPORT*  Clinical Data: Weakness.  Altered mental status.  Dehydration.  CHEST - 2 VIEW  Comparison: 08/06/2010  Findings: Biapical pleuroparenchymal scarring noted.  Sternal deformity from remote fracture noted.  Cardiac and mediastinal contours appear unremarkable.  No pleural effusion is identified.  There is a suggestion of minimal left lower lobe subsegmental atelectasis or scarring.  IMPRESSION:  1.  Minimal left lower lobe subsegmental  atelectasis or scarring. 2.  Deformity from remote sternal fracture. 3.  Mild biapical pleuroparenchymal scarring.  Original Report Authenticated By: Dellia Cloud, M.D.   Ct  Head Wo Contrast  05/16/2011  *RADIOLOGY REPORT*  Clinical Data: Weakness and lethargy  CT HEAD WITHOUT CONTRAST  Technique:  Contiguous axial images were obtained from the base of the skull through the vertex without contrast.  Comparison: 08/06/2010  Findings: There is diffuse patchy low density throughout the subcortical and periventricular white matter consistent with chronic small vessel ischemic change.  There is prominence of the sulci and ventricles consistent with brain atrophy.  There is no evidence for acute brain infarct, hemorrhage or mass.  Paranasal sinuses and mastoid air cells are clear.  IMPRESSION:  1.  Small vessel ischemic disease and brain atrophy.  Original Report Authenticated By: Rosealee Albee, M.D.   Dg Chest Port 1 View  05/20/2011  *RADIOLOGY REPORT*  Clinical Data: Pulmonary edema.  PORTABLE CHEST - 1 VIEW  Comparison: 05/18/2011.  Findings: Increasing bilateral pleural effusions and basilar atelectasis.  Right IJ central line is present with the tip in the lower SVC.  No change in support apparatus.  Increasing basilar airspace disease and atelectasis. Cardiopericardial silhouette and mediastinal contours are unchanged.  IMPRESSION: Worsening pulmonary aeration with increasing airspace disease and atelectasis. Increasing pleural effusions.  Stable support apparatus.  Original Report Authenticated By: Andreas Newport, M.D.   Dg Chest Port 1 View  05/18/2011  *RADIOLOGY REPORT*  Clinical Data: Shortness of breath  PORTABLE CHEST - 1 VIEW  Comparison: Portable chest x-ray of 05/16/2011  Findings: The minimal pulmonary vascular congestion has improved. Heart size is stable.  The right IJ central venous line is unchanged in position.  IMPRESSION: Improvement in mild pulmonary vascular congestion.   Original Report Authenticated By: Juline Patch, M.D.   Dg Chest Port 1 View  05/16/2011  *RADIOLOGY REPORT*  Clinical Data: Central line placement.  PORTABLE CHEST - 1 VIEW  Comparison: Chest radiograph performed earlier today at 08:40 a.m.  Findings: The patient's right IJ line is seen ending about the mid SVC.  The lungs are well-aerated.  Vascular congestion is noted, without significant pulmonary edema.  The left costophrenic angle is incompletely imaged on this study.  However, there is no evidence of focal opacification, pleural effusion or pneumothorax. Mild density at the right lung apex is likely outside the patient.  The cardiomediastinal silhouette is within normal limits.  No acute osseous abnormalities are seen.  IMPRESSION:  1.  Right IJ line seen ending about the mid SVC. 2.  Vascular congestion noted, without significant pulmonary edema.  Original Report Authenticated By: Tonia Ghent, M.D.    Consults: Treatment Team:  Pleas Koch, MD   Subjective:    Overnight Issues:   Objective:  Vital signs for last 24 hours: Temp:  [99.7 F (37.6 C)-101.1 F (38.4 C)] 100 F (37.8 C) (11/16 1200) Pulse Rate:  [85-100] 96  (11/16 1200) Resp:  [18-25] 25  (11/16 1200) BP: (114-132)/(23-69) 132/69 mmHg (11/16 0900) SpO2:  [92 %-99 %] 95 % (11/16 1200) Weight:  [59.4 kg (130 lb 15.3 oz)] 130 lb 15.3 oz (59.4 kg) (11/16 0410)  Hemodynamic parameters for last 24 hours:    Intake/Output from previous day: 11/15 0701 - 11/16 0700 In: 2185 [P.O.:260; I.V.:1725; IV Piggyback:200] Out: 2250 [Urine:2250]  Intake/Output this shift: Total I/O In: 435 [P.O.:60; I.V.:375] Out: 250 [Urine:250]  Vent settings for last 24 hours:    Physical Exam:  General: alert Neuro: alert and confused HEENT/Neck: no JVD, PERRL and trach-clean, intact Resp: clear to auscultation bilaterally and normal percussion bilaterally CVS: regular rate and rhythm,  S1, S2 normal, no murmur, click, rub or  gallop GI: soft, nontender, BS WNL, no r/g Skin: pressure ulcer stage 2 on buttocks per nursing Extremities: no edema, no erythema, pulses WNL and edema 2+  Assessment/Plan:   NEURO  Altered Mental Status:  delirium   Plan: This is better and was likely related to patient being septic. Patient seems to be at baseline currently.   PULM  Doing well no issues not requiring specific oxygen therapy.   Plan: Nothing to do.   CARDIO  Hypovolemic Shock (Secondary to sepsis from a urinary origin now much better and off pressors)    Plan: Monitor blood pressure improvement cautiously her home medication regimen.   RENAL  Chronic Kidney Disease: Stage 2 (GFR 60-89)   Plan: Likely has a stage I to see daily we will get of the meds in the morning. Patient needs to also be hydrated however wean IV fluids and review her blood pressures.   GI  GI no issues other than some diarrhea. Will monitor if this continues we will get a C. difficile, however hold for now   Plan:   ID  Lower Urinary Tract Infection (gram negative organism)   Plan: Likely the lower urinary tract infection secondary to Escherichia coli sepsis pan sensitive. We will have the patient ambulate we will also have the patient removed the Foley catheter and go to the bedside commode. At baseline she is able to go from bed to wheelchair to bathroom with help presents for the past 3 months. It appears that the patient although having significant dementia does seem to have full faculties in terms of first physical strength.  HEME  Anemia anemia of critical illness) Thrombocytopenia (DIC-related, heparin-induced and suspected)   Plan: Patient had a platelet count on admission of 170's this is the subsequently dropped to the 115 range and I suspect this may be a combination of either heparin-induced, cytopenia versus secondary to sepsis. I'll hold off on getting out of for the time being but if there does seem to be more than 50% drop we will go  ahead [place] on a period placed on SCDs for the time being. She may benefit from Arixtra.   Her anemia seems to be sent secondary to anemia critical illness and now hold off on getting anemic, present time the patient will be reviewed with regards to her blood.   ENDO Hyperglycemia (suspected) and Hypoglycemia Which is iatrogenic. Patient will be weaned off of the ICU protocol on transitioning to the floor.   Plan:   Global Issues      LOS: 4 days   Additional comments:I reviewed the patient's other test results.  and I have discussed and reviewed with family members patient's Patient does appear to wish to BE a full code per her son's insistence at bedside. They do seem to have an idea as to the severity of her illness but states that patient would've wanted everything done in terms of mechanical ventilation intubation and pressor support if this does recur in the near future.  We will comply with her wishes and complete a course of antibiotics.  Critical Care Total Time*: 45 Minutes  Vicke Plotner,JAI 05/20/2011  *Care during the described time interval was provided by me and/or other providers on the critical care team.  I have reviewed this patient's available data, including medical history, events of note, physical examination and test results as part of my evaluation.

## 2011-05-21 LAB — BASIC METABOLIC PANEL
BUN: 3 mg/dL — ABNORMAL LOW (ref 6–23)
Calcium: 7.2 mg/dL — ABNORMAL LOW (ref 8.4–10.5)
Creatinine, Ser: 0.75 mg/dL (ref 0.50–1.10)
GFR calc non Af Amer: 76 mL/min — ABNORMAL LOW (ref >90)
Glucose, Bld: 98 mg/dL (ref 70–99)

## 2011-05-21 LAB — CBC
MCH: 31.2 pg (ref 26.0–34.0)
MCHC: 34.2 g/dL (ref 30.0–36.0)
MCV: 91.2 fL (ref 78.0–100.0)
Platelets: 153 10*3/uL (ref 150–400)

## 2011-05-21 LAB — DIFFERENTIAL
Basophils Relative: 0 % (ref 0–1)
Eosinophils Absolute: 0.2 10*3/uL (ref 0.0–0.7)
Eosinophils Relative: 3 % (ref 0–5)
Monocytes Relative: 9 % (ref 3–12)
Neutrophils Relative %: 52 % (ref 43–77)

## 2011-05-21 LAB — GLUCOSE, CAPILLARY: Glucose-Capillary: 129 mg/dL — ABNORMAL HIGH (ref 70–99)

## 2011-05-21 MED ORDER — CIPROFLOXACIN HCL 500 MG PO TABS
500.0000 mg | ORAL_TABLET | Freq: Two times a day (BID) | ORAL | Status: DC
  Administered 2011-05-21 – 2011-05-23 (×5): 500 mg via ORAL
  Filled 2011-05-21 (×8): qty 1

## 2011-05-21 MED ORDER — POTASSIUM CHLORIDE 20 MEQ/15ML (10%) PO LIQD
40.0000 meq | Freq: Two times a day (BID) | ORAL | Status: DC
  Administered 2011-05-21 – 2011-05-23 (×4): 40 meq via ORAL
  Filled 2011-05-21 (×7): qty 30

## 2011-05-21 NOTE — Progress Notes (Signed)
[facility] progress note   [facility] NOTE  This 77 year old female was admitted 05/16/2011 with a diagnosis of possible sepsis from urinary origin with hypotension and was found down by her family. The patient required IV resuscitation as well as IV pressors until 11.13.12 and was noted to have a lactate on admission of 3.4. She is growing out Escherichia coli over 100,000 colony-forming units and this is currently being treated with a #5 of Rocephin and ciprofloxacin.\ No real issues overnight however off going nurse noted patient posture and about 4 times and has not been continent. She remains agitated and although is doing better according to nursing still requires significant redirection.    Subjective: Review of systems is not possible given her advanced dementia however patient does not appear to be in pain. Tolerating diet fairly well. Son and room states he ambulates mother to bathroom 3 times a day and she does void on command. She wears depends at home at baseline.   Treatment Team:  Pleas Koch, MD Objective: Vital signs in last 24 hours: Temp:  [98.2 F (36.8 C)-100 F (37.8 C)] 98.9 F (37.2 C) (11/17 0800) Pulse Rate:  [91-97] 97  (11/17 0400) Resp:  [19-25] 20  (11/17 0400) BP: (83-135)/(45-66) 114/45 mmHg (11/17 0400) SpO2:  [92 %-96 %] 93 % (11/17 0800) Weight:  [58.1 kg (128 lb 1.4 oz)] 128 lb 1.4 oz (58.1 kg) (11/17 0000) Weight change: -1.3 kg (-2 lb 13.9 oz)  Intake/Output Summary (Last 24 hours) at 05/21/11 1134 Last data filed at 05/21/11 1000  Gross per 24 hour  Intake   1020 ml  Output    600 ml  Net    420 ml    General: alert  Neuro: alert and confused  HEENT/Neck: no JVD, PERRL and  intact  Resp: clear to auscultation bilaterally and normal percussion bilaterally  CVS: regular rate and rhythm, S1, S2 normal, no murmur, click, rub or gallop  GI: soft, nontender, BS WNL, no r/g  Skin: pressure ulcer stage 2 on buttocks per  nursing  Extremities: no edema, no erythema, pulses WNL and edema 1  Lab Results:  Basename 05/21/11 0520 05/20/11 0410 05/19/11 0402  NA 140 141 --  K 3.0* 3.4* --  CL 111 112 --  CO2 23 22 --  GLUCOSE 98 100* --  BUN 3* 6 --  CREATININE 0.75 0.93 --  CALCIUM 7.2* 7.3* --  MG -- 1.7 1.8  PHOS -- -- 1.9*   No results found for this basename: AST:2,ALT:2,ALKPHOS:2,BILITOT:2,PROT:2,ALBUMIN:2 in the last 72 hours No results found for this basename: LIPASE:2,AMYLASE:2 in the last 72 hours  Basename 05/21/11 0520 05/20/11 0410  WBC 8.7 7.9  NEUTROABS 4.5 --  HGB 9.2* 9.9*  HCT 26.9* 28.5*  MCV 91.2 91.1  PLT 153 115*   Micro Results: Recent Results (from the past 240 hour(s))  URINE CULTURE     Status: Normal   Collection Time   05/16/11 11:44 AM      Component Value Range Status Comment   Specimen Description URINE, CATHETERIZED   Final    Special Requests NONE   Final    Setup Time [id number]   Final    Colony Count >=100,000 COLONIES/ML   Final    Culture ESCHERICHIA COLI   Final    Report Status 05/18/2011 FINAL   Final    Organism ID, Bacteria ESCHERICHIA COLI   Final   MRSA PCR SCREENING     Status: Normal  Collection Time   05/16/11  5:26 PM      Component Value Range Status Comment   MRSA by PCR NEGATIVE  NEGATIVE  Final   CULTURE, BLOOD (ROUTINE X 2)     Status: Normal (Preliminary result)   Collection Time   05/16/11 10:11 PM      Component Value Range Status Comment   Specimen Description BLOOD LEFT ARM   Final    Special Requests BOTTLES DRAWN AEROBIC ONLY    Final    Setup Time [id number]   Final    Culture     Final    Value:        BLOOD CULTURE RECEIVED NO GROWTH TO DATE CULTURE WILL BE HELD FOR 5 DAYS BEFORE ISSUING A FINAL NEGATIVE REPORT   Report Status PENDING   Incomplete   CULTURE, BLOOD (ROUTINE X 2)     Status: Normal (Preliminary result)   Collection Time   05/16/11 10:47 PM      Component Value Range Status Comment   Specimen  Description BLOOD  LINE   Final    Special Requests BOTTLES DRAWN AEROBIC AND ANAEROBIC    Final    Setup Time [id number]   Final    Culture     Final    Value:        BLOOD CULTURE RECEIVED NO GROWTH TO DATE CULTURE WILL BE HELD FOR 5 DAYS BEFORE ISSUING A FINAL NEGATIVE REPORT   Report Status PENDING   Incomplete    Studies/Results: All prior radiology findings reviewed    Medications:  I have reviewed the patient's current medications. Scheduled:   . ciprofloxacin  400 mg Intravenous Q12H  . heparin subcutaneous  5,000 Units Subcutaneous Q8H  . insulin aspart  0-9 Units Subcutaneous TID WC  . pantoprazole  40 mg Oral Q1200  . DISCONTD: pantoprazole (PROTONIX) IV  40 mg Intravenous QHS   Continuous:   . DISCONTD: sodium chloride 75 mL/hr at 05/20/11 0636   Scheduled Meds:   . ciprofloxacin  400 mg Intravenous Q12H  . heparin subcutaneous  5,000 Units Subcutaneous Q8H  . insulin aspart  0-9 Units Subcutaneous TID WC  . pantoprazole  40 mg Oral Q1200  . DISCONTD: pantoprazole (PROTONIX) IV  40 mg Intravenous QHS   Continuous Infusions:   . DISCONTD: sodium chloride 75 mL/hr at 05/20/11 0636   PRN Meds:.acetaminophen, dextrose, dextrose Medications Discontinued During This Encounter  Medication Reason  . atenolol-chlorthalidone (TENORETIC) 100-25 MG per tablet   . potassium chloride 40 MEQ/15ML (20%) LIQD   . Multiple Vitamins-Minerals (MULTIVITAMINS THER. W/MINERALS) TABS   . aspirin 81 MG tablet   . cefTRIAXone (ROCEPHIN) 1 g in dextrose 5 % 50 mL IVPB Duplicate  . cefTRIAXone (ROCEPHIN) 2 g in dextrose 5 % 50 mL IVPB Duplicate  . cefTRIAXone (ROCEPHIN) 2 g in dextrose 5 % 50 mL IVPB   . cefTRIAXone (ROCEPHIN) 1 g in dextrose 5 % 50 mL IVPB   . 0.9 %  sodium chloride infusion   . 0.9 %  sodium chloride infusion   . dextrose 5 %-0.45 % sodium chloride infusion   . sodium chloride 0.9 % bolus 1,318 mL   . magnesium sulfate IVPB 1 g 100 mL   . DOBUTamine  (DOBUTREX) infusion 4000 mcg/mL   . sodium chloride 0.9 % bolus 500 mL   . norepinephrine (LEVOPHED) 4 mg in dextrose 5 % 250 mL infusion   . DOPamine (INTROPIN) 800 mg  in dextrose 5 % 250 mL infusion   . sodium chloride 0.9 % bolus 500 mL   . magnesium bolus via infusion 2 g   . DOBUTamine (DOBUTREX) infusion 4000 mcg/mL   . norepinephrine (LEVOPHED) 4 mg in dextrose 5 % 250 mL infusion   . sodium chloride 0.9 % bolus 500 mL   . potassium chloride 10 MEQ/100ML IVPB Returned to ADS  . acetaminophen (TYLENOL) 650 MG suppository   . cefTRIAXone (ROCEPHIN) 1 g in dextrose 5 % 50 mL IVPB   . cefTRIAXone (ROCEPHIN) 1 g in dextrose 5 % 50 mL IVPB   . 0.45 % sodium chloride infusion   . pantoprazole (PROTONIX) injection 40 mg    Assessment/Plan: NEURO  Altered Mental Status: delirium    Plan: This is better and was likely related to patient being septic. Patient seems to be at baseline currently.   PULM  Doing well no issues not requiring specific oxygen therapy.    Plan: Nothing to do.   CARDIO  Hypovolemic Shock (Secondary to sepsis from a urinary origin now much better and off pressors)     Plan: Monitor blood pressure improvement cautiously her home medication regimen.   RENAL  Chronic Kidney Disease: Stage 2 (GFR 60-89)    Plan: Likely has a stage I to see daily we will get of the meds in the morning. Patient needs to also be hydrated however wean IV fluids and review her blood pressures.  Her potassium is low today and we will replace this orally   GI  GI no issues other than some diarrhea. 05/19/2011 and 05/20/2011 however the diarrhea since ceased. We will monitor    Plan:   ID  Lower Urinary Tract Infection (gram negative organism)    Plan: Likely the lower urinary tract infection secondary to Escherichia coli sepsis pan sensitive. We will have the patient ambulate we will also have the patient removed the Foley catheter and go to the bedside commode.  At baseline she is able to go  from bed to wheelchair to bathroom with help presents for the past 3 months. I will narrow antibiotics to ciprofloxacin today and see clinical response.   HEME  Anemia anemia of critical illness)  Thrombocytopenia (DIC-related, heparin-induced and suspected)    Plan: Patient had a platelet count on admission of 170's this is the subsequently dropped to the 115 range -it has bounded back up to 153. And I think this is likely sepsis related.   ENDO  Hyperglycemia (suspected) and Hypoglycemia Which is iatrogenic. Patient will be weaned off of the ICU protocol on transitioning to the floor.    Plan:   Global Issues   patient will need extensive therapy and care. Her sons are very supportive and do seem to understand the complexity of her needs and are very able to give assistance when they're present however there are 2 brothers in the family and both of them work full-time. I think patient for safety measures would be best served in a nursing facility. I'll have social work discuss this with them to      LOS: 5 days   [facility] 05/21/2011, 11:34 AM

## 2011-05-21 NOTE — Plan of Care (Signed)
Problem: Phase I Progression Outcomes Goal: Voiding-avoid urinary catheter unless indicated Outcome: Progressing Pt is voiding but incontinent  Problem: Phase III Progression Outcomes Goal: Voiding independently Outcome: Not Progressing Pt has dementia and can not consistently verbalize when to void or have bowel movement

## 2011-05-22 LAB — GLUCOSE, CAPILLARY
Glucose-Capillary: 93 mg/dL (ref 70–99)
Glucose-Capillary: 111 mg/dL — ABNORMAL HIGH (ref 70–99)

## 2011-05-22 LAB — CBC
Hemoglobin: 9.5 g/dL — ABNORMAL LOW (ref 12.0–15.0)
MCH: 31.1 pg (ref 26.0–34.0)
Platelets: 207 10*3/uL (ref 150–400)
RBC: 3.05 MIL/uL — ABNORMAL LOW (ref 3.87–5.11)
WBC: 9.6 10*3/uL (ref 4.0–10.5)

## 2011-05-22 LAB — BASIC METABOLIC PANEL
CO2: 25 mEq/L (ref 19–32)
Calcium: 7.8 mg/dL — ABNORMAL LOW (ref 8.4–10.5)
Creatinine, Ser: 0.72 mg/dL (ref 0.50–1.10)
GFR calc non Af Amer: 77 mL/min — ABNORMAL LOW (ref >90)
Glucose, Bld: 104 mg/dL — ABNORMAL HIGH (ref 70–99)
Sodium: 139 mEq/L (ref 135–145)

## 2011-05-22 LAB — DIFFERENTIAL
Eosinophils Absolute: 0.2 10*3/uL (ref 0.0–0.7)
Lymphocytes Relative: 34 % (ref 12–46)
Lymphs Abs: 3.3 10*3/uL (ref 0.7–4.0)
Monocytes Relative: 9 % (ref 3–12)
Neutrophils Relative %: 54 % (ref 43–77)

## 2011-05-22 NOTE — Plan of Care (Signed)
Problem: Phase I Progression Outcomes Goal: Voiding-avoid urinary catheter unless indicated Outcome: Not Progressing Placed foley ; d./t frequent urination worried about skin breakdown

## 2011-05-22 NOTE — Progress Notes (Signed)
[facility] progress note   Interval h/o:-  This 77 year old female was admitted 05/16/2011 with a diagnosis of possible sepsis from urinary origin with hypotension and was found down by her family. The patient required IV resuscitation as well as IV pressors until 11.13.12 and was noted to have a lactate on admission of 3.4. She is growing out Escherichia coli over 100,000 colony-forming units and this is currently being treated with a #5 of Rocephin and ciprofloxacin--> now on D#3/7 Cipro monotherapy No real issues overnight however off going nurse noted patient posture and about 4 times and has not been continent. She remains agitated and although is doing better according to nursing still requires significant redirection-We had to place a faoly back in 11.18 for skin proctection. Has had low grade temp 100.5 11.17.12 and is incontinent of urine still-her electrolytes have been slightly off, and have been replaced Her son really wishes her top be able to go home--there does not however appear to be any 24/7 supervision at present   per PT        Subjective: At basleine-pleasantly demented.  NO ROS possible  Treatment Team:  Pleas Koch, MD Objective: Vital signs in last 24 hours: Temp:  [96.9 F (36.1 C)-100.5 F (38.1 C)] 96.9 F (36.1 C) (11/18 1710) Pulse Rate:  [100-106] 100  (11/18 1710) Resp:  [21] 21  (11/18 0400) BP: (140-151)/(66-99) 140/72 mmHg (11/18 0800) SpO2:  [93 %-95 %] 93 % (11/18 1710) Weight:  [59 kg (130 lb 1.1 oz)] 130 lb 1.1 oz (59 kg) (11/18 0400) Weight change: 0.9 kg (1 lb 15.7 oz)  Intake/Output Summary (Last 24 hours) at 05/22/11 1901 Last data filed at 05/22/11 1839  Gross per 24 hour  Intake   1290 ml  Output    675 ml  Net    615 ml    General: alert and no respiratory distress  Neuro: alert, oriented and nonfocal exam  HEENT/Neck: no JVD  Resp: clear to auscultation bilaterally and normal percussion bilaterally  CVS: regular  rate and rhythm, S1, S2 normal, no murmur, click, rub or gallop and hadno further beats of V tach on the monitor  GI: soft, nontender, BS WNL, no r/g, tender and distended  Skin: no rash  Extremities: no edema, no erythema, pulses WNL   Lab Results:  Basename 05/22/11 0315 05/21/11 0520 05/20/11 0410  NA 139 140 --  K 3.6 3.0* --  CL 107 111 --  CO2 25 23 --  GLUCOSE 104* 98 --  BUN 3* 3* --  CREATININE 0.72 0.75 --  CALCIUM 7.8* 7.2* --  MG -- -- 1.7  PHOS -- -- --   No results found for this basename: AST:2,ALT:2,ALKPHOS:2,BILITOT:2,PROT:2,ALBUMIN:2 in the last 72 hours No results found for this basename: LIPASE:2,AMYLASE:2 in the last 72 hours  Basename 05/22/11 0830 05/21/11 0520  WBC 9.6 8.7  NEUTROABS 5.2 4.5  HGB 9.5* 9.2*  HCT 27.9* 26.9*  MCV 91.5 91.2  PLT 207 153   No results found for this basename: CKTOTAL:3,CKMB:3,CKMBINDEX:3,TROPONINI:3 in the last 72 hours No results found for this basename: POCBNP:3 in the last 72 hours No results found for this basename: DDIMER:2 in the last 72 hours No results found for this basename: HGBA1C:2 in the last 72 hours No results found for this basename: CHOL:2,HDL:2,LDLCALC:2,TRIG:2,CHOLHDL:2,LDLDIRECT:2 in the last 72 hours No results found for this basename: TSH,T4TOTAL,FREET3,T3FREE,THYROIDAB in the last 72 hours No results found for this basename: VITAMINB12:2,FOLATE:2,FERRITIN:2,TIBC:2,IRON:2,RETICCTPCT:2 in the last 72 hours Micro  Results: Recent Results (from the past 240 hour(s))  URINE CULTURE     Status: Normal   Collection Time   05/16/11 11:44 AM      Component Value Range Status Comment   Specimen Description URINE, CATHETERIZED   Final    Special Requests NONE   Final    Setup Time [id number]   Final    Colony Count >=100,000 COLONIES/ML   Final    Culture ESCHERICHIA COLI   Final    Report Status 05/18/2011 FINAL   Final    Organism ID, Bacteria ESCHERICHIA COLI   Final   MRSA PCR SCREENING      Status: Normal   Collection Time   05/16/11  5:26 PM      Component Value Range Status Comment   MRSA by PCR NEGATIVE  NEGATIVE  Final   CULTURE, BLOOD (ROUTINE X 2)     Status: Normal (Preliminary result)   Collection Time   05/16/11 10:11 PM      Component Value Range Status Comment   Specimen Description BLOOD LEFT ARM   Final    Special Requests BOTTLES DRAWN AEROBIC ONLY    Final    Setup Time [id number]   Final    Culture     Final    Value:        BLOOD CULTURE RECEIVED NO GROWTH TO DATE CULTURE WILL BE HELD FOR 5 DAYS BEFORE ISSUING A FINAL NEGATIVE REPORT   Report Status PENDING   Incomplete   CULTURE, BLOOD (ROUTINE X 2)     Status: Normal (Preliminary result)   Collection Time   05/16/11 10:47 PM      Component Value Range Status Comment   Specimen Description BLOOD  LINE   Final    Special Requests BOTTLES DRAWN AEROBIC AND ANAEROBIC    Final    Setup Time [id number]   Final    Culture     Final    Value:        BLOOD CULTURE RECEIVED NO GROWTH TO DATE CULTURE WILL BE HELD FOR 5 DAYS BEFORE ISSUING A FINAL NEGATIVE REPORT   Report Status PENDING   Incomplete     All of the Patients imaging studies have been reviewed-Please refer to prior notes  Medications: I have reviewed the patient's current medications. Scheduled Meds:   . ciprofloxacin  500 mg Oral BID  . heparin subcutaneous  5,000 Units Subcutaneous Q8H  . insulin aspart  0-9 Units Subcutaneous TID WC  . pantoprazole  40 mg Oral Q1200  . potassium chloride  40 mEq Oral BID   Continuous Infusions:  PRN Meds:.acetaminophen, dextrose Medications Discontinued During This Encounter  Medication Reason  . atenolol-chlorthalidone (TENORETIC) 100-25 MG per tablet   . potassium chloride 40 MEQ/15ML (20%) LIQD   . Multiple Vitamins-Minerals (MULTIVITAMINS THER. W/MINERALS) TABS   . aspirin 81 MG tablet   . cefTRIAXone (ROCEPHIN) 1 g in dextrose 5 % 50 mL IVPB Duplicate  . cefTRIAXone (ROCEPHIN) 2 g  in dextrose 5 % 50 mL IVPB Duplicate  . cefTRIAXone (ROCEPHIN) 2 g in dextrose 5 % 50 mL IVPB   . cefTRIAXone (ROCEPHIN) 1 g in dextrose 5 % 50 mL IVPB   . 0.9 %  sodium chloride infusion   . 0.9 %  sodium chloride infusion   . dextrose 5 %-0.45 % sodium chloride infusion   . sodium chloride 0.9 % bolus 1,318 mL   . magnesium sulfate IVPB 1  g 100 mL   . DOBUTamine (DOBUTREX) infusion 4000 mcg/mL   . sodium chloride 0.9 % bolus 500 mL   . norepinephrine (LEVOPHED) 4 mg in dextrose 5 % 250 mL infusion   . DOPamine (INTROPIN) 800 mg in dextrose 5 % 250 mL infusion   . sodium chloride 0.9 % bolus 500 mL   . magnesium bolus via infusion 2 g   . DOBUTamine (DOBUTREX) infusion 4000 mcg/mL   . norepinephrine (LEVOPHED) 4 mg in dextrose 5 % 250 mL infusion   . sodium chloride 0.9 % bolus 500 mL   . potassium chloride 10 MEQ/100ML IVPB Returned to ADS  . acetaminophen (TYLENOL) 650 MG suppository   . cefTRIAXone (ROCEPHIN) 1 g in dextrose 5 % 50 mL IVPB   . cefTRIAXone (ROCEPHIN) 1 g in dextrose 5 % 50 mL IVPB   . 0.45 % sodium chloride infusion   . pantoprazole (PROTONIX) injection 40 mg   . ciprofloxacin (CIPRO) IVPB 400 mg    Assessment/Plan: NEURO  Altered Mental Status: delirium  Plan: This is better and was likely related to patient being septic. Patient seems to be at baseline currently-she is known to have at least stage 3-4 Dementia, and likely is a total care patient.  PULM Doing well no issues not requiring specific oxygen therapy.  Plan: Nothing to do. At present time   CARDIO Hypovolemic Shock (Secondary to sepsis from a urinary origin now much better and off pressors)  Plan: Monitor blood pressure improvement cautiously her home medication regimen    RENAL Chronic Kidney Disease: Stage 2 (GFR 60-89)  Plan: Likely has a stage I CKD--has some Hypokalemia, which was corrected   GI no issues other than some diarrhea. Will monitor if this continues we will get a C. difficile,  however hold for now  Plan   ID Lower Urinary Tract Infection (gram negative organism)  Plan: Likely the lower urinary tract infection secondary to Escherichia coli sepsis pan sensitive. We will have the patient ambulate we will also have the patient removed the Foley catheter and go to the bedside commode. At baseline she is able to go from bed to wheelchair to bathroom with help presents for the past 3 months. It appears that the patient although having significant dementia does seem to have full faculties in terms of first physical strength.  Was on Rocephin iv 11/13-->11/15  Cipro IV 11/14>>11/17  Currently on PO cipro, d#3/7 more days   HEME Anemia anemia of critical illness) Thrombocytopenia (DIC-related, heparin-induced and suspected)  Plan: Patient had a platelet count on admission of 170's this is the subsequently dropped to the 115 range and I suspect this may be a combination of either heparin-induced, cytopenia versus secondary to sepsis.  Her anemia seems to be sent secondary to anemia critical illness and now hold off on getting anemic, present time the patient will be reviewed with regards to her blood.  Her platelet count has since stabilized and TCP was likely multifactorial   ENDO Hyperglycemia (suspected) and Hypoglycemia Which is iatrogenic.  weaned off of the ICU protocol on transitioning to the floor.  Plan: Global Issues    Additional comments:I have discussed and reviewed with family members patient's Patient can be transferred to the floor.  She is likely stable enough o be d/c in am 11.19.12 if no acute changes    LOS: 6 days   Ida Milbrath,JAI 05/22/2011, 7:01 PM
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
## 2011-05-22 NOTE — Plan of Care (Signed)
Problem: Phase II Progression Outcomes Goal: Progress activity as tolerated unless otherwise ordered Outcome: Progressing Walked with PT/OT 2 person assist

## 2011-05-22 NOTE — Progress Notes (Addendum)
[facility] STEP-DOWN PROGRESS NOTE  [facility] NOTE  This 77 year old female was admitted 05/16/2011 with a diagnosis of possible sepsis from urinary origin with hypotension and was found down by her family. The patient required IV resuscitation as well as IV pressors until 11.13.12 and was noted to have a lactate on admission of 3.4. She is growing out Escherichia coli over 100,000 colony-forming units and this is currently being treated with a #5 of Rocephin and ciprofloxacin. No real issues overnight however off going nurse noted patient posture and about 4 times and has not been continent. She remains agitated and although is doing better according to nursing still requires significant redirection. Has had low grade temp 100.5 last night and is incontinent of urine still-her electrolytes have been slightly off, and have been replaced Her son really wishes her top be able to go home--there does not however appear to be any 24/7 supervision at present She has to be seen by pt/ot prior to this    Patient Details:    Stephanie Bruce is an 77 y.o. female.  Lines, Airways, Drains: CVC Triple Lumen 05/16/11 Right Internal jugular (Active)  Site Assessment Clean;Dry 05/21/2011  8:00 PM  Proximal Lumen Status Capped (Central line) 05/21/2011  8:00 PM  Medial Capped (Central line) 05/21/2011  8:00 PM  Distal Lumen Status Capped (Central line) 05/21/2011  8:00 PM  Line Care Connections checked and tightened 05/21/2011  8:00 PM  Dressing Type Transparent 05/21/2011  8:00 PM  Dressing Status Clean;Dry 05/21/2011  8:00 PM  Dressing Intervention Dressing reinforced 05/19/2011  8:00 AM  Dressing Change Due 05/25/11 05/21/2011  8:00 PM  Indication for Insertion or Continuance of Line Limited venous access - need for IV therapy >5 days (PICC only) 05/21/2011  8:00 PM    Anti-infectives:  Anti-infectives     Start     Dose/Rate Route Frequency Ordered Stop   05/21/11 1145   ciprofloxacin  (CIPRO) tablet 500 mg        500 mg Oral 2 times daily 05/21/11 1146     05/19/11 1830   ciprofloxacin (CIPRO) IVPB 400 mg  Status:  Discontinued        400 mg 200 mL/hr over 60 Minutes Intravenous Every 12 hours 05/19/11 1026 05/21/11 1146   05/17/11 1800   cefTRIAXone (ROCEPHIN) 1 g in dextrose 5 % 50 mL IVPB  Status:  Discontinued        1 g 100 mL/hr over 30 Minutes Intravenous Every 24 hours 05/16/11 1830 05/19/11 1026   05/17/11 0000   cefTRIAXone (ROCEPHIN) 1 g in dextrose 5 % 50 mL IVPB  Status:  Discontinued        1 g 100 mL/hr over 30 Minutes Intravenous Every 24 hours 05/16/11 1829 05/16/11 1836   05/16/11 1845   cefTRIAXone (ROCEPHIN) 1 g in dextrose 5 % 50 mL IVPB  Status:  Discontinued        1 g 100 mL/hr over 30 Minutes Intravenous  Once 05/16/11 1804 05/16/11 1824   05/16/11 1845   cefTRIAXone (ROCEPHIN) 2 g in dextrose 5 % 50 mL IVPB  Status:  Discontinued        2 g 100 mL/hr over 30 Minutes Intravenous  Once 05/16/11 1803 05/16/11 1833   05/16/11 1845   cefTRIAXone (ROCEPHIN) 1 g in dextrose 5 % 50 mL IVPB  Status:  Discontinued        1 g 100 mL/hr over 30 Minutes Intravenous  Once  05/16/11 1833 05/19/11 1032   05/16/11 1830   cefTRIAXone (ROCEPHIN) 2 g in dextrose 5 % 50 mL IVPB  Status:  Discontinued        2 g 100 mL/hr over 30 Minutes Intravenous  Once 05/16/11 1802 05/16/11 1825   05/16/11 1245   cefTRIAXone (ROCEPHIN) 1 g in dextrose 5 % 50 mL IVPB        1 g 100 mL/hr over 30 Minutes Intravenous  Once 05/16/11 1239 05/16/11 1418          Microbiology: Results for orders placed during the hospital encounter of 05/16/11  URINE CULTURE     Status: Normal   Collection Time   05/16/11 11:44 AM      Component Value Range Status Comment   Specimen Description URINE, CATHETERIZED   Final    Special Requests NONE   Final    Setup Time [id number]   Final    Colony Count >=100,000 COLONIES/ML   Final    Culture ESCHERICHIA COLI   Final    Report  Status 05/18/2011 FINAL   Final    Organism ID, Bacteria ESCHERICHIA COLI   Final   MRSA PCR SCREENING     Status: Normal   Collection Time   05/16/11  5:26 PM      Component Value Range Status Comment   MRSA by PCR NEGATIVE  NEGATIVE  Final   CULTURE, BLOOD (ROUTINE X 2)     Status: Normal (Preliminary result)   Collection Time   05/16/11 10:11 PM      Component Value Range Status Comment   Specimen Description BLOOD LEFT ARM   Final    Special Requests BOTTLES DRAWN AEROBIC ONLY    Final    Setup Time [id number]   Final    Culture     Final    Value:        BLOOD CULTURE RECEIVED NO GROWTH TO DATE CULTURE WILL BE HELD FOR 5 DAYS BEFORE ISSUING A FINAL NEGATIVE REPORT   Report Status PENDING   Incomplete   CULTURE, BLOOD (ROUTINE X 2)     Status: Normal (Preliminary result)   Collection Time   05/16/11 10:47 PM      Component Value Range Status Comment   Specimen Description BLOOD  LINE   Final    Special Requests BOTTLES DRAWN AEROBIC AND ANAEROBIC    Final    Setup Time [id number]   Final    Culture     Final    Value:        BLOOD CULTURE RECEIVED NO GROWTH TO DATE CULTURE WILL BE HELD FOR 5 DAYS BEFORE ISSUING A FINAL NEGATIVE REPORT   Report Status PENDING   Incomplete     ABG    Component Value Date/Time   O2SAT 69.5 05/17/2011 1650     Best Practice/Protocols:  VTE Prophylaxis: Heparin (SQ) Sepsis (11.12.12>>>present day)  Events: Nil overnight  Studies: Dg Chest 2 View  05/16/2011  *RADIOLOGY REPORT*  Clinical Data: Weakness.  Altered mental status.  Dehydration.  CHEST - 2 VIEW  Comparison: 08/06/2010  Findings: Biapical pleuroparenchymal scarring noted.  Sternal deformity from remote fracture noted.  Cardiac and mediastinal contours appear unremarkable.  No pleural effusion is identified.  There is a suggestion of minimal left lower lobe subsegmental atelectasis or scarring.  IMPRESSION:  1.  Minimal left lower lobe subsegmental atelectasis or  scarring. 2.  Deformity from remote sternal fracture. 3.  Mild  biapical pleuroparenchymal scarring.  Original Report Authenticated By: Dellia Cloud, M.D.   Ct Head Wo Contrast  05/16/2011  *RADIOLOGY REPORT*  Clinical Data: Weakness and lethargy  CT HEAD WITHOUT CONTRAST  Technique:  Contiguous axial images were obtained from the base of the skull through the vertex without contrast.  Comparison: 08/06/2010  Findings: There is diffuse patchy low density throughout the subcortical and periventricular white matter consistent with chronic small vessel ischemic change.  There is prominence of the sulci and ventricles consistent with brain atrophy.  There is no evidence for acute brain infarct, hemorrhage or mass.  Paranasal sinuses and mastoid air cells are clear.  IMPRESSION:  1.  Small vessel ischemic disease and brain atrophy.  Original Report Authenticated By: Rosealee Albee, M.D.   Dg Chest Port 1 View  05/20/2011  *RADIOLOGY REPORT*  Clinical Data: Pulmonary edema.  PORTABLE CHEST - 1 VIEW  Comparison: 05/18/2011.  Findings: Increasing bilateral pleural effusions and basilar atelectasis.  Right IJ central line is present with the tip in the lower SVC.  No change in support apparatus.  Increasing basilar airspace disease and atelectasis. Cardiopericardial silhouette and mediastinal contours are unchanged.  IMPRESSION: Worsening pulmonary aeration with increasing airspace disease and atelectasis. Increasing pleural effusions.  Stable support apparatus.  Original Report Authenticated By: Andreas Newport, M.D.   Dg Chest Port 1 View  05/18/2011  *RADIOLOGY REPORT*  Clinical Data: Shortness of breath  PORTABLE CHEST - 1 VIEW  Comparison: Portable chest x-ray of 05/16/2011  Findings: The minimal pulmonary vascular congestion has improved. Heart size is stable.  The right IJ central venous line is unchanged in position.  IMPRESSION: Improvement in mild pulmonary vascular congestion.  Original Report  Authenticated By: Juline Patch, M.D.   Dg Chest Port 1 View  05/16/2011  *RADIOLOGY REPORT*  Clinical Data: Central line placement.  PORTABLE CHEST - 1 VIEW  Comparison: Chest radiograph performed earlier today at 08:40 a.m.  Findings: The patient's right IJ line is seen ending about the mid SVC.  The lungs are well-aerated.  Vascular congestion is noted, without significant pulmonary edema.  The left costophrenic angle is incompletely imaged on this study.  However, there is no evidence of focal opacification, pleural effusion or pneumothorax. Mild density at the right lung apex is likely outside the patient.  The cardiomediastinal silhouette is within normal limits.  No acute osseous abnormalities are seen.  IMPRESSION:  1.  Right IJ line seen ending about the mid SVC. 2.  Vascular congestion noted, without significant pulmonary edema.  Original Report Authenticated By: Tonia Ghent, M.D.    Consults: Treatment Team:  Pleas Koch, MD   Subjective:    Overnight Issues:   Objective:  Vital signs for last 24 hours: Temp:  [98.3 F (36.8 C)-100.5 F (38.1 C)] 98.3 F (36.8 C) (11/18 0630) Pulse Rate:  [106] 106  (11/18 0400) Resp:  [21] 21  (11/18 0400) BP: (110-151)/(45-99) 141/66 mmHg (11/18 0400) SpO2:  [95 %] 95 % (11/18 0400) Weight:  [59 kg (130 lb 1.1 oz)] 130 lb 1.1 oz (59 kg) (11/18 0400)  Hemodynamic parameters for last 24 hours:    Intake/Output from previous day: 11/17 0701 - 11/18 0700 In: 1520 [P.O.:1520] Out: 100 [Urine:100]  Intake/Output this shift:    Vent settings for last 24 hours:    Physical Exam:  General: alert and no respiratory distress Neuro: alert, oriented and nonfocal exam HEENT/Neck: no JVD Resp: clear to auscultation bilaterally and normal percussion  bilaterally CVS: regular rate and rhythm, S1, S2 normal, no murmur, click, rub or gallop and had 7 beats of V tach on the monitor GI: soft, nontender, BS WNL, no r/g, tender and  distended Skin: no rash Extremities: no edema, no erythema, pulses WNL  Assessment/Plan:   NEURO  Altered Mental Status: delirium  Plan: This is better and was likely related to patient being septic. Patient seems to be at baseline currently-she is known to have at least stage 3-4 Dementia, and likely is a total care patient   PULM Doing well no issues not requiring specific oxygen therapy.  Plan: Nothing to do. At present time   CARDIO  Hypovolemic Shock (Secondary to sepsis from a urinary origin now much better and off pressors) Plan: Monitor blood pressure improvement cautiously her home medication regimen  . RENAL Chronic Kidney Disease: Stage 2 (GFR 60-89)  Plan: Likely has a stage I CKD--has some Hypokalemia, which was corrected  GI no issues other than some diarrhea. Will monitor if this continues we will get a C. difficile, however hold for now  Plan   ID Lower Urinary Tract Infection (gram negative organism)  Plan: Likely the lower urinary tract infection secondary to Escherichia coli sepsis pan sensitive. We will have the patient ambulate we will also have the patient removed the Foley catheter and go to the bedside commode. At baseline she is able to go from bed to wheelchair to bathroom with help presents for the past 3 months. It appears that the patient although having significant dementia does seem to have full faculties in terms of first physical strength.    Was on Rocephin iv 11/13-->11/15                  Cipro IV 11/14>>11/17               Currently on PO cipro, d#2/7 more days   HEME Anemia anemia of critical illness) Thrombocytopenia (DIC-related, heparin-induced and suspected)  Plan: Patient had a platelet count on admission of 170's this is the subsequently dropped to the 115 range and I suspect this may be a combination of either heparin-induced, cytopenia versus secondary to sepsis. Her anemia seems to be sent secondary to anemia critical illness and now  hold off on getting anemic, present time the patient will be reviewed with regards to her blood.  Her platelet count has since stabilized and TCP was likely multifactorial  ENDO Hyperglycemia (suspected) and Hypoglycemia Which is iatrogenic. Patient will be weaned off of the ICU protocol on transitioning to the floor.  Plan: Global Issues   Additional comments:I have discussed and reviewed with family members patient's Patient can be transferred to the floor  Critical Care Total Time: 30 Minutes  Zanaya Baize,JAI 05/22/2011  *Care during the described time interval was provided by me and/or other providers on the critical care team.  I have reviewed this patient's available data, including medical history, events of note, physical examination and test results as part of my evaluation.  I HAVE ALSO DISCUSSED THIS PLAN OF CARE EXHAUSTIVELY WITH PATIENT AND SURROGATE DECISION MAKERS

## 2011-05-23 LAB — CBC
HCT: 28.4 % — ABNORMAL LOW (ref 36.0–46.0)
Hemoglobin: 9.3 g/dL — ABNORMAL LOW (ref 12.0–15.0)
MCH: 30.7 pg (ref 26.0–34.0)
MCHC: 32.7 g/dL (ref 30.0–36.0)
MCV: 93.7 fL (ref 78.0–100.0)
RDW: 16.5 % — ABNORMAL HIGH (ref 11.5–15.5)

## 2011-05-23 LAB — GLUCOSE, CAPILLARY
Glucose-Capillary: 92 mg/dL (ref 70–99)
Glucose-Capillary: 113 mg/dL — ABNORMAL HIGH (ref 70–99)

## 2011-05-23 LAB — CULTURE, BLOOD (ROUTINE X 2)
Culture  Setup Time: 201211130201
Culture: NO GROWTH

## 2011-05-23 LAB — BASIC METABOLIC PANEL
BUN: <3 mg/dL — ABNORMAL LOW (ref 6–23)
Creatinine, Ser: 0.71 mg/dL (ref 0.50–1.10)
GFR calc Af Amer: 89 mL/min — ABNORMAL LOW (ref >90)
GFR calc non Af Amer: 77 mL/min — ABNORMAL LOW (ref >90)
Glucose, Bld: 102 mg/dL — ABNORMAL HIGH (ref 70–99)
Potassium: 4.1 mEq/L (ref 3.5–5.1)

## 2011-05-23 MED ORDER — CIPROFLOXACIN HCL 500 MG PO TABS: 500.0000 mg | ORAL_TABLET | Freq: Two times a day (BID) | ORAL | Status: AC

## 2011-05-23 MED ORDER — POTASSIUM CHLORIDE 20 MEQ/15ML (10%) PO LIQD: 20.0000 meq | Freq: Two times a day (BID) | ORAL | Status: AC

## 2011-05-23 NOTE — Progress Notes (Signed)
Pt. D/c to home to the care of her two sons. Pt's family verbalizes and states they understand d/c instructions. Taken to car via wheelchair. Marcelino Duster, RN

## 2011-05-23 NOTE — Progress Notes (Signed)
Pt. Is too weak and unsteady to ambulate; per PT note in pt chart, requires at least 2 persons for pt to safely ambulate. Marcelino Duster, RN

## 2011-05-23 NOTE — Progress Notes (Signed)
Spoke with son, Fayrene Fearing at [phone]. States he will provide needed care for patient, comes home during the day to provide for her, arrangement with neighbors to frequently check on her. He is agreeable to [facility] services as long as they are covered by insurance, states cannot afford anything out of pocket at this time but would appreciate any services to assist with care of patient at home. Per PT recommendations, order for PT and Aide written. Patient has used [facility] in the past and son would like to use them again, notified Baxter Hire with [facility] to arrange, notified her of son's concerns. Plan for d/c home today, son is aware.

## 2011-05-23 NOTE — Discharge Summary (Signed)
Physician Discharge Summary  Patient ID: Stephanie Bruce MRN: [id number] DOB/AGE: February 16, 1925 77 y.o.  Admit date: 05/16/2011 Discharge date: 05/23/2011  Admission Diagnoses:Sepsis-likely urinary origin  Discharge Diagnoses:  Principal Problem:  *Sepsis associated hypotension Active Problems:  Syncope and collapse  UTI (lower urinary tract infection)  Renal failure, acute  Dementia  Leukocytosis   Discharged Condition: fair  Hospital Course:         This 77 year old female was admitted 05/16/2011 with a diagnosis of possible sepsis from urinary origin with hypotension and was found down by her family. The patient required IV resuscitation as well as IV pressors until 11.13.12.  [facility] assumed care of the patient from Critical Care medicine at that point- was noted to have a lactate on admission of 3.4. She was growing out Escherichia coli over 100,000 colony-forming units and this was  being treated with a #5 of Rocephin and ciprofloxacin--> now on D#3/7 Cipro monotherapy-which will continue. She has not been continent, foley was removed but than replaced for skin protection She remains agitated and although is doing better according to nursing still requires significant redirection- Her son really wishes her top be able to go home--there does not however appear to be any 24/7 supervision at present  per PT PT/Ot reviewed the patient in entirety on 11.19.12 and documented that she would need 24 hours assistance-Son's wishes were that patient at least have PT/OT and an RN to help them take care of her as both son's work full time jobs, but are able to come home to check on her at home during the day and there are neighbours nearby who also can check in on her frequently  Consults: none  Significant Diagnostic Studies: See Noted PEr CCM MD  Treatments: IV hydration, antibiotics: vancomycin, Cipro and ceftriaxone and cardiac meds: carvedilol and amlodipine  Discharge  Exam: Blood pressure 124/62, pulse 97, temperature 97.9 F (36.6 C), temperature source Axillary, resp. rate 20, height 5\' 6"  (1.676 m), weight 59 kg (130 lb 1.1 oz), SpO2 92.00%. General appearance: uncooperative Eyes: negative Ears: normal TM's and external ear canals both ears and NAD Resp: clear to auscultation bilaterally Cardio: regular rate and rhythm, S1, S2 normal, no murmur, click, rub or gallop Pulses: 2+ and symmetric  Disposition: Home-Health Care Svc  Discharge Orders    Future Orders Please Complete By Expires   Diet - low sodium heart healthy      Increase activity slowly      Discharge instructions      Comments:   Follow c regular Doctor   Call MD for:  temperature >100.4      Call MD for:  severe uncontrolled pain      Call MD for:  redness, tenderness, or signs of infection (pain, swelling, redness, odor or green/yellow discharge around incision site)      Call MD for:  extreme fatigue        Current Discharge Medication List    START taking these medications   Details  ciprofloxacin (CIPRO) 500 MG tablet Take 1 tablet (500 mg total) by mouth 2 (two) times daily. Qty: 8 tablet, Refills: 0    potassium chloride 20 MEQ/15ML (10%) solution Take 15 mLs (20 mEq total) by mouth 2 (two) times daily. Qty: 500 mL, Refills: 0      STOP taking these medications     aspirin 81 MG tablet      atenolol-chlorthalidone (TENORETIC) 100-25 MG per tablet      Multiple  Vitamins-Minerals (MULTIVITAMINS THER. W/MINERALS) TABS      potassium chloride 40 MEQ/15ML (20%) LIQD          Signed: Jemery Stacey,JAI 05/23/2011, 11:41 AM

## 2011-05-23 NOTE — Progress Notes (Signed)
Occupational Therapy Note Spoke with patient's son who states he has been assisting with patient's ADL for several years now. She is total care with ADL. She has a tub/shower chair and a 3 in 1 already. Will sign off for OT.  Judithann Sauger OTR/L [phone]

## 2012-11-17 IMAGING — CT CT HEAD W/O CM
2 series · 16 of 30 positions shown, 20 images · non-contrast
Comparison: 08/06/2010

CLINICAL DATA: Weakness and lethargy

CT HEAD WITHOUT CONTRAST
TECHNIQUE: Contiguous axial images were obtained from the base of
the skull through the vertex without contrast.

[Series 2: head w/o · axial · non-contrast · 0.43mm/px · z∈[-138,-18]mm · 13 of 30 slices shown, 17 images]
[im 3/30  brain]
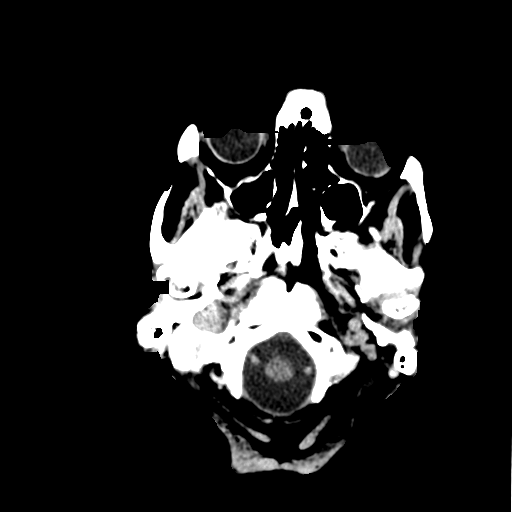
[im 3/30  bone]
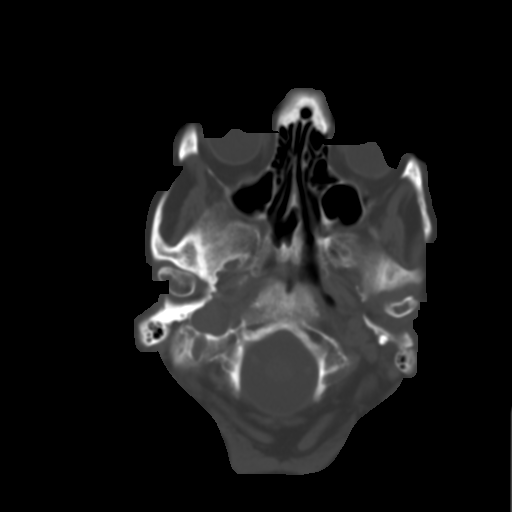
[im 5/30  brain]
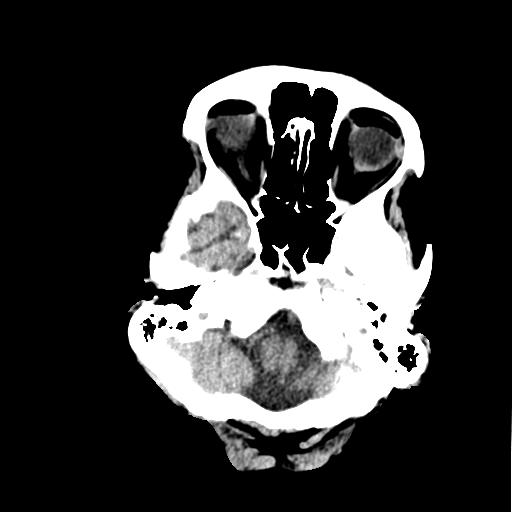
[im 7/30  brain]
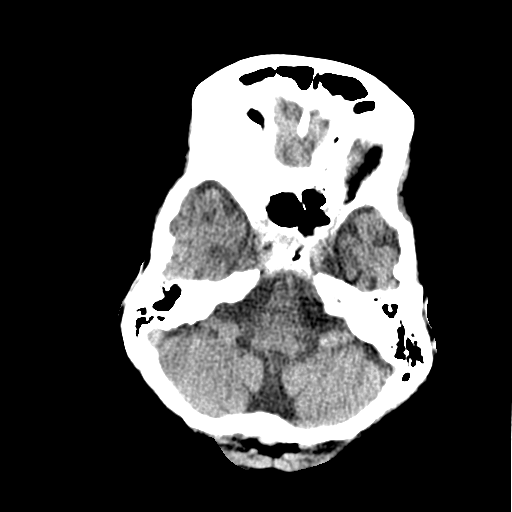
[im 9/30  brain]
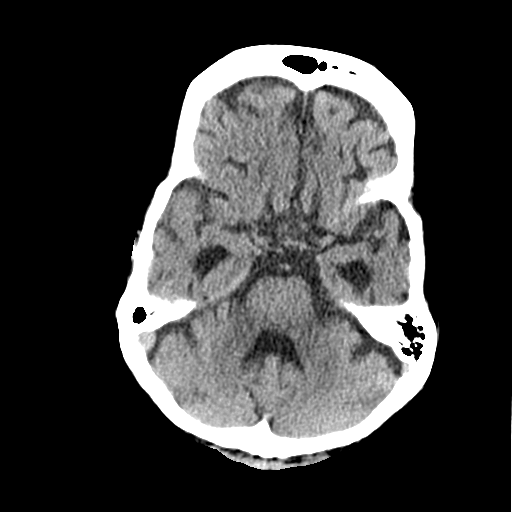
[im 11/30  brain]
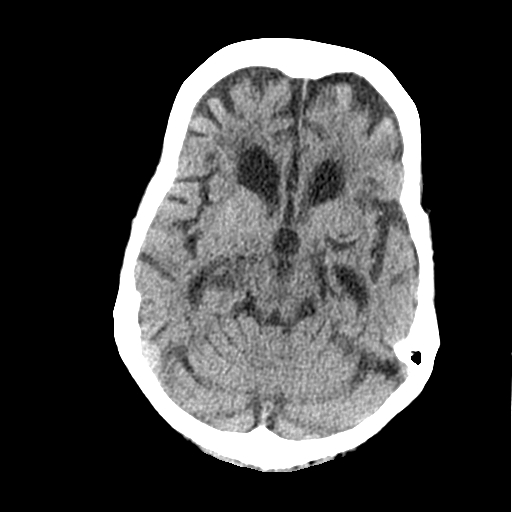
[im 11/30  bone]
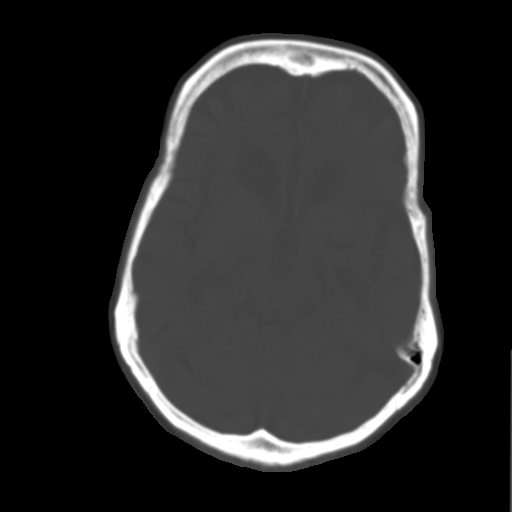
[im 13/30  brain]
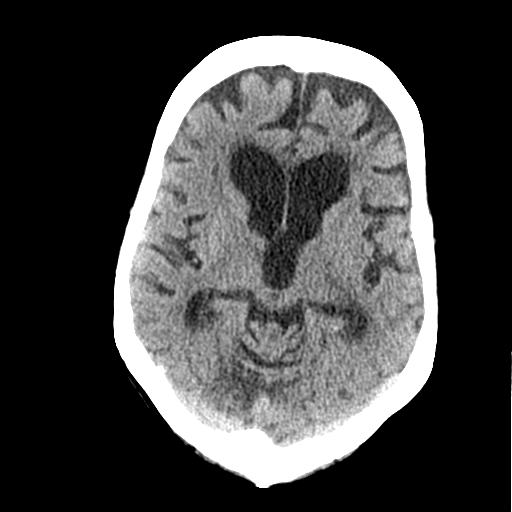
[im 15/30  brain]
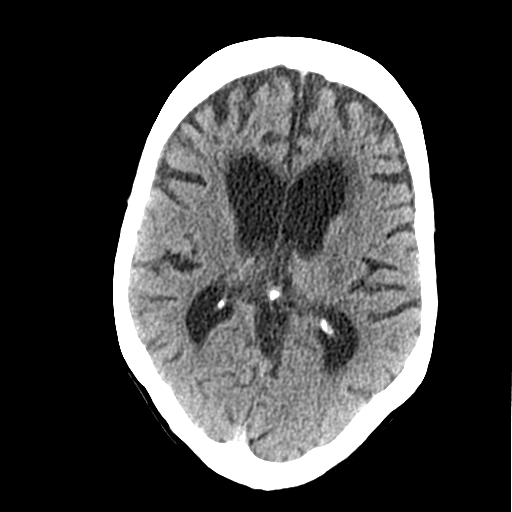
[im 17/30  brain]
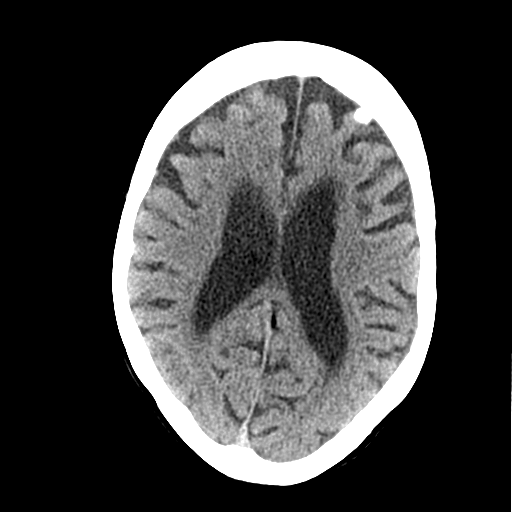
[im 19/30  brain]
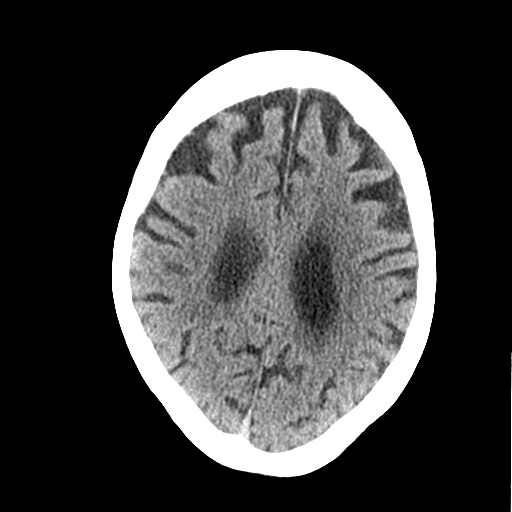
[im 19/30  bone]
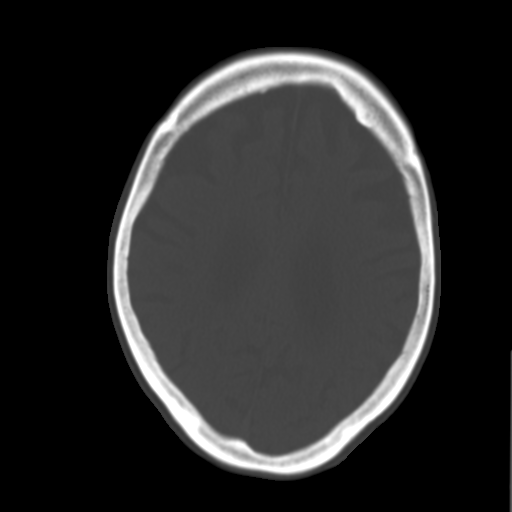
[im 21/30  brain]
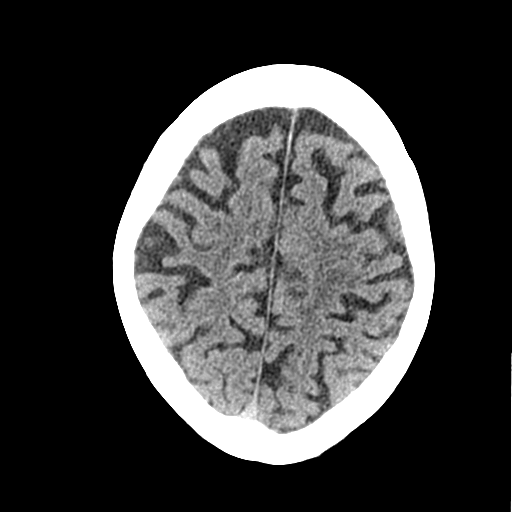
[im 23/30  brain]
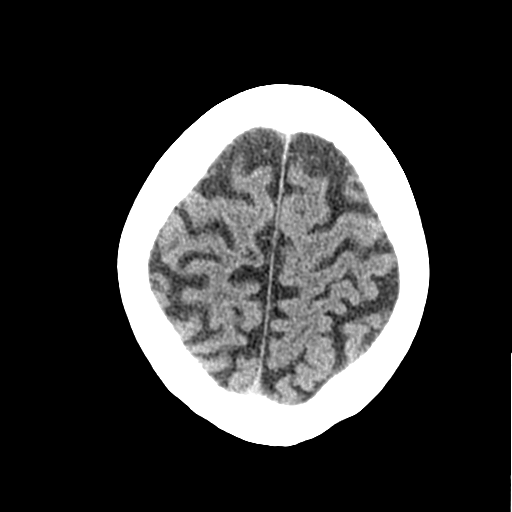
[im 25/30  brain]
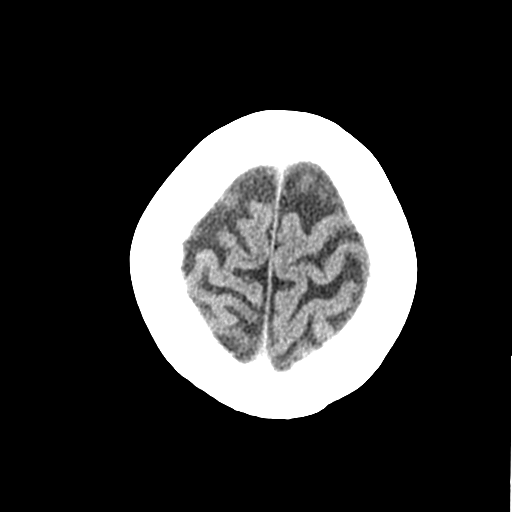
[im 27/30  brain]
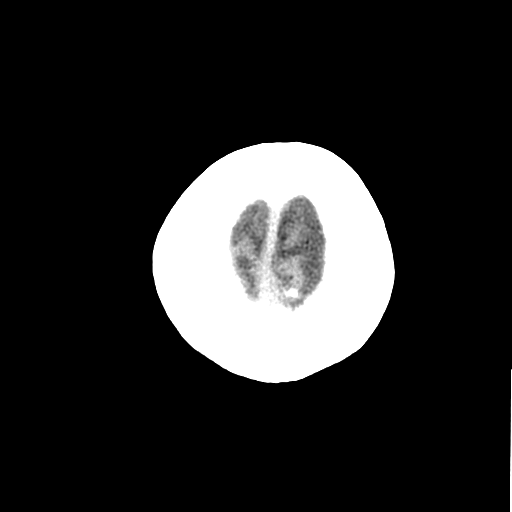
[im 27/30  bone]
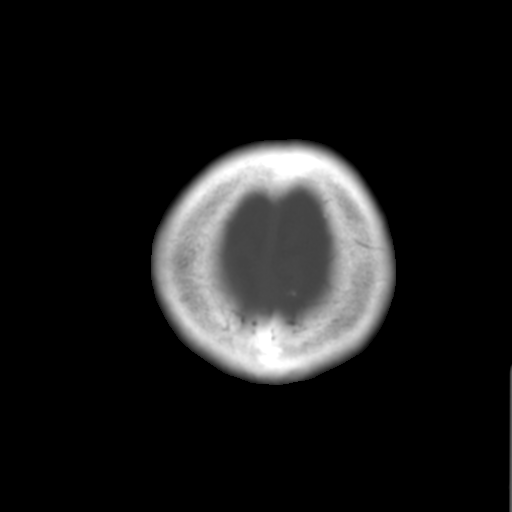

[Series 3: bone windows · axial · 0.43mm/px · z∈[-138,-98]mm · 3 of 30 slices shown]
[im 3/30  bone]
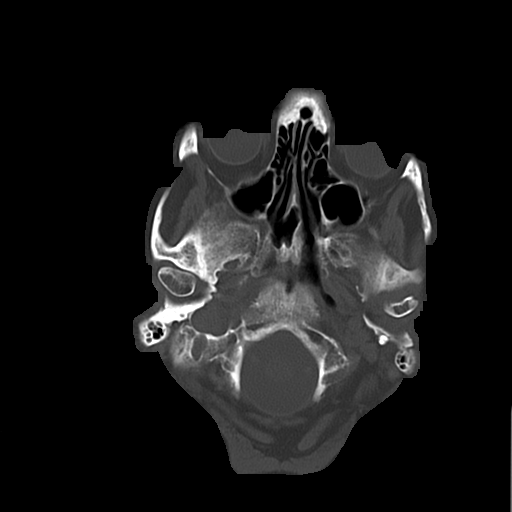
[im 7/30  bone]
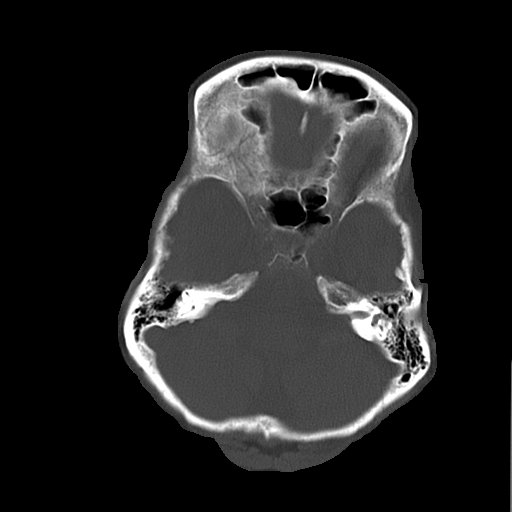
[im 11/30  bone]
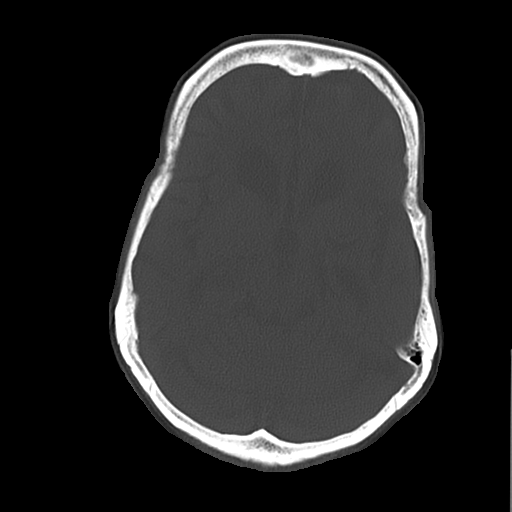

[16 of 30 positions shown; findings below may reference images not displayed]

FINDINGS: There is diffuse patchy low density throughout the
subcortical and periventricular white matter consistent with
chronic small vessel ischemic change.

There is prominence of the sulci and ventricles consistent with
brain atrophy.

There is no evidence for acute brain infarct, hemorrhage or mass.

Paranasal sinuses and mastoid air cells are clear.
IMPRESSION: 1.  Small vessel ischemic disease and brain atrophy.

## 2012-11-17 IMAGING — CR DG CHEST 2V
2 series · 2 of 2 positions shown · non-contrast
Comparison: 08/06/2010

CLINICAL DATA: Weakness.  Altered mental status.  Dehydration.

CHEST - 2 VIEW

[w chest lat]
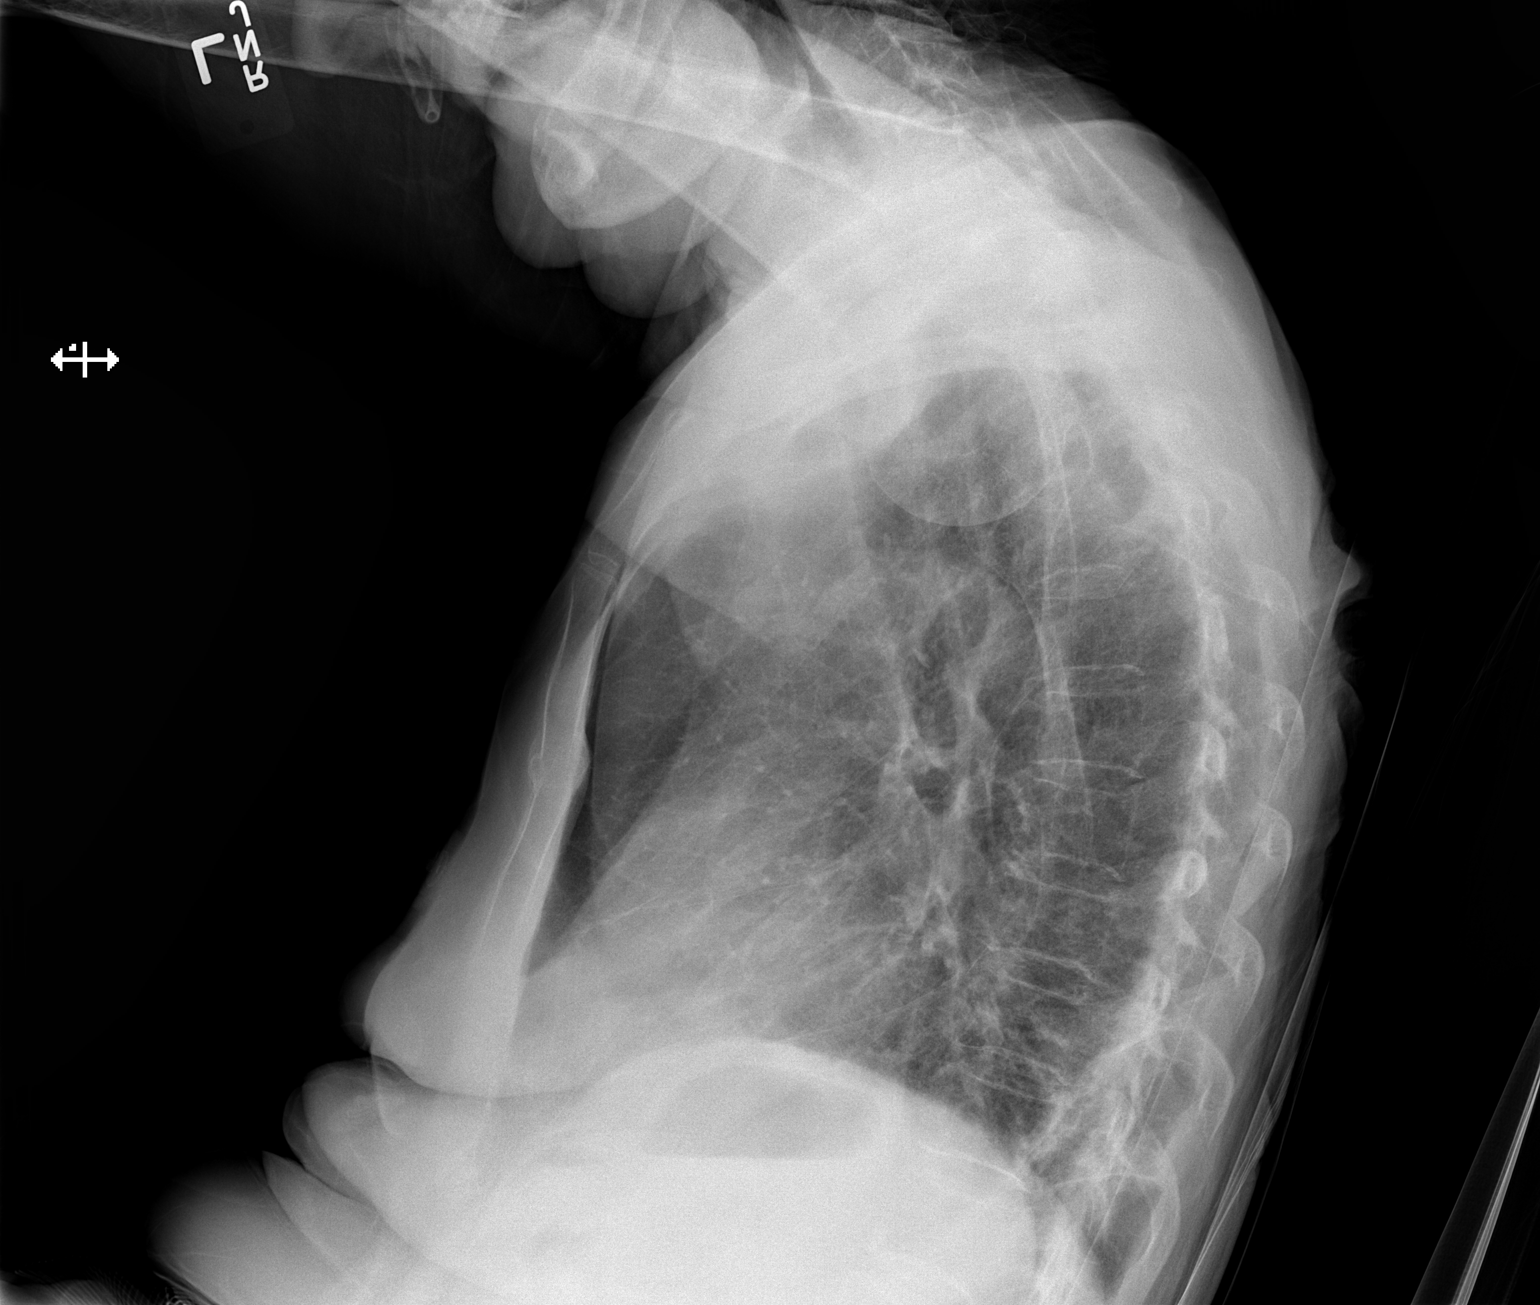

[x chest ap]
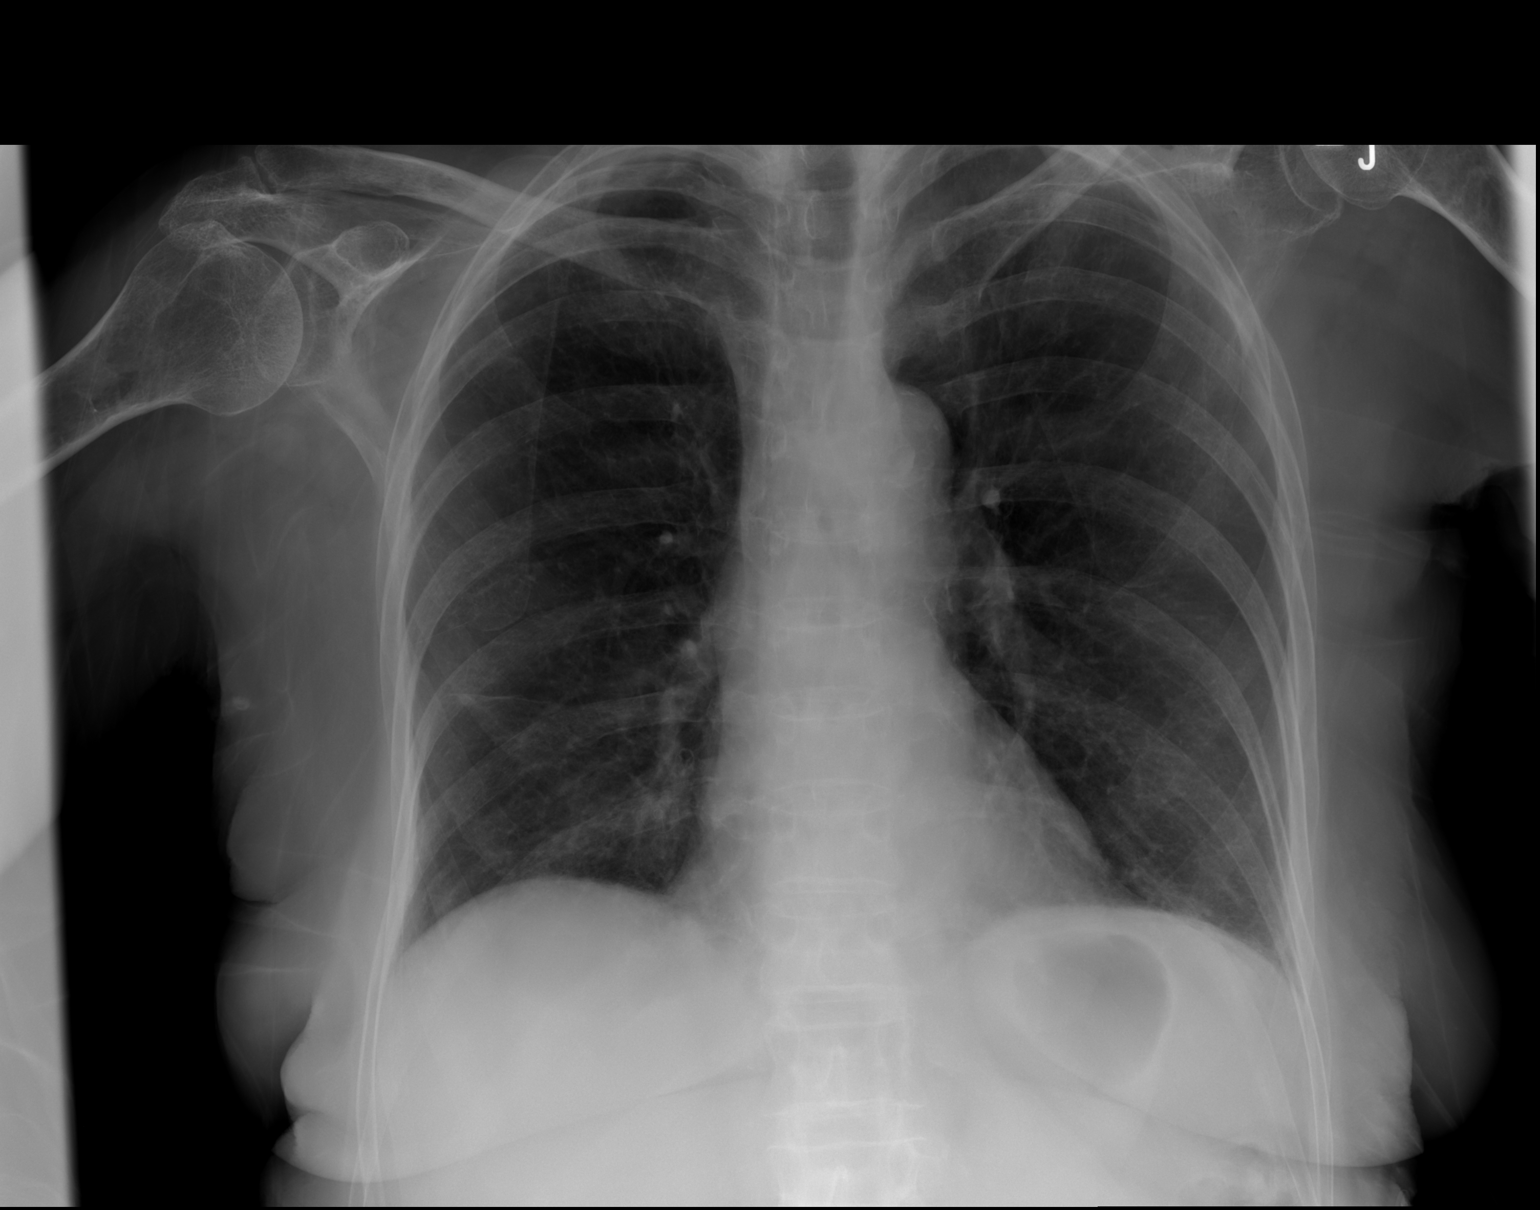

[2 of 2 positions shown; findings below may reference images not displayed]

FINDINGS: Biapical pleuroparenchymal scarring noted.

Sternal deformity from remote fracture noted.

Cardiac and mediastinal contours appear unremarkable.

No pleural effusion is identified.  There is a suggestion of
minimal left lower lobe subsegmental atelectasis or scarring.
IMPRESSION: 1.  Minimal left lower lobe subsegmental atelectasis or scarring.
2.  Deformity from remote sternal fracture.
3.  Mild biapical pleuroparenchymal scarring.

## 2012-11-17 IMAGING — CR DG CHEST 1V PORT
1 series · 1 of 1 positions shown · non-contrast
Comparison: Chest radiograph performed earlier today at [DATE] a.m.

CLINICAL DATA: Central line placement.

PORTABLE CHEST - 1 VIEW

[AP]
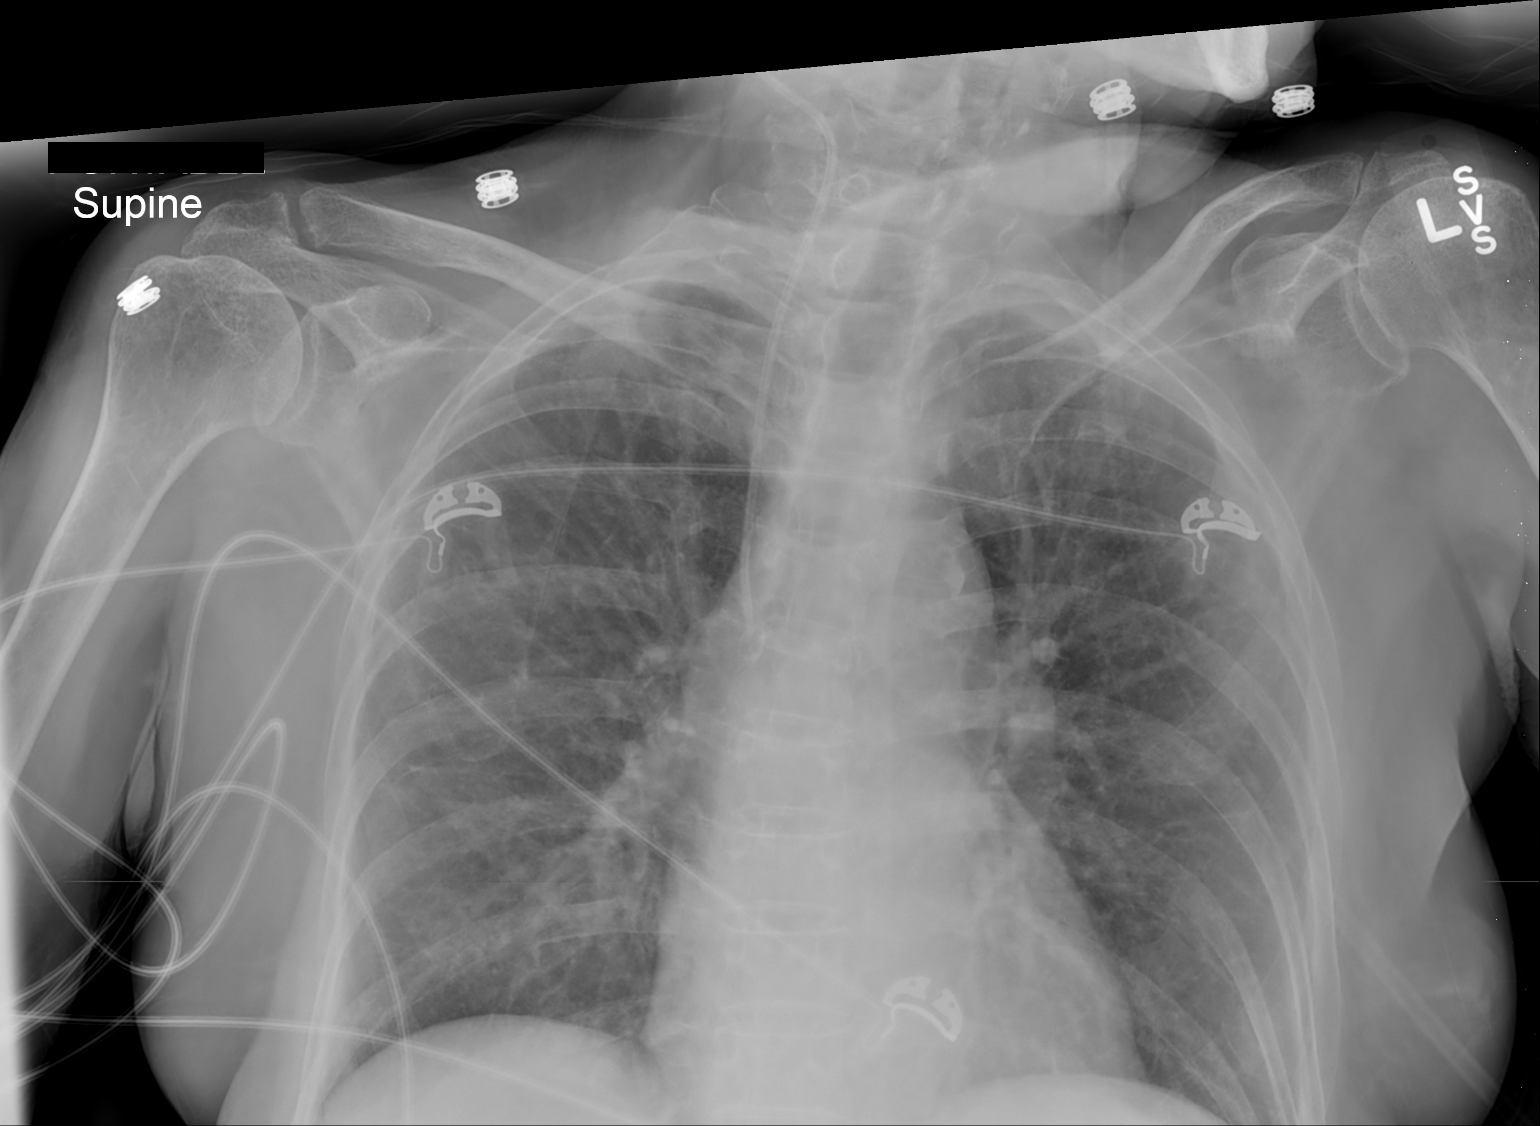

[1 of 1 positions shown; findings below may reference images not displayed]

FINDINGS: The patient's right IJ line is seen ending about the mid
SVC.

The lungs are well-aerated.  Vascular congestion is noted, without
significant pulmonary edema.  The left costophrenic angle is
incompletely imaged on this study.  However, there is no evidence
of focal opacification, pleural effusion or pneumothorax. Mild
density at the right lung apex is likely outside the patient.

The cardiomediastinal silhouette is within normal limits.  No acute
osseous abnormalities are seen.
IMPRESSION: 1.  Right IJ line seen ending about the mid SVC.
2.  Vascular congestion noted, without significant pulmonary edema.

## 2014-04-07 ENCOUNTER — Encounter (HOSPITAL_BASED_OUTPATIENT_CLINIC_OR_DEPARTMENT_OTHER): Payer: Medicare Other | Attending: Plastic Surgery

## 2014-04-07 DIAGNOSIS — M40209 Unspecified kyphosis, site unspecified: Secondary | ICD-10-CM | POA: Diagnosis not present

## 2014-04-07 DIAGNOSIS — L89104 Pressure ulcer of unspecified part of back, stage 4: Secondary | ICD-10-CM | POA: Diagnosis present
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
## 2014-04-14 DIAGNOSIS — L89104 Pressure ulcer of unspecified part of back, stage 4: Secondary | ICD-10-CM | POA: Diagnosis not present

## 2014-04-14 DIAGNOSIS — M40209 Unspecified kyphosis, site unspecified: Secondary | ICD-10-CM | POA: Diagnosis not present

## 2014-04-14 NOTE — Progress Notes (Signed)
[facility]  NAME:  Stephanie Bruce, Stephanie Bruce                  ACCOUNT NO.:  [id number]  MEDICAL RECORD NO.:  [id number]      DATE OF BIRTH:  12-Oct-1924  PHYSICIAN:  Theodoro Kos, DO       VISIT DATE:  04/14/2014                                  OFFICE VISIT   The patient is an 80 year old female who is here for followup on her back wound that is the bone.  She does not have [place] bed at home yet nor the VAC, but we are working on this.  There has been no change in her medications or social history.  No change in her review of systems.  On exam, she is asleep.  Her son is present and give full history.  Her breathing is unlabored.  Her heart rate is regular.  He feeds her directly.  She does not have a feeding tube.  The wound looks a little bit cleaner.  There is less periwound irritation and redness.  We are working on getting the [employer] bed, a home VAC, continue with Santyl, refer to nutritionist.  Her prealbumin was 16.9.  Offloading is extremely important and increasing her protein as well. We will see her back in 1 week.  He was also given a refill on Santyl prescription for his mom.     Theodoro Kos, DO     CS/MEDQ  D:  04/14/2014  T:  04/14/2014  Job:  [id number]

## 2014-04-21 DIAGNOSIS — M40209 Unspecified kyphosis, site unspecified: Secondary | ICD-10-CM | POA: Diagnosis not present

## 2014-04-21 DIAGNOSIS — L89104 Pressure ulcer of unspecified part of back, stage 4: Secondary | ICD-10-CM | POA: Diagnosis not present

## 2014-04-21 NOTE — Progress Notes (Signed)
[facility]  NAME:  Stephanie Bruce, Stephanie Bruce                  ACCOUNT NO.:  [id number]  MEDICAL RECORD NO.:  [id number]      DATE OF BIRTH:  10-27-1924  PHYSICIAN:  Theodoro Kos, DO       VISIT DATE:  04/21/2014                                  OFFICE VISIT   The patient is an 80 year old female who is here for a followup on her back ulcer from pressure.  She has good support from her son.  She does not have an air mattress bed, but we are working on that.  We did get the [facility], and the son brought it with him.  There is no change in medications or social history.  On exam, she is alert, but non communicative.  Her son does all the talking.  She is responsive to pain.  Her breathing is unlabored.  Her heart rate is regular.  The back area shows slight signs of improvement.  Debridement was done and Endoform was placed with the VAC.  We will change the VAC in a week and try to get some home health for them as well, and she is to stay off it.  Continue with protein intake, multivitamins. We will see her back in 1 week.     Theodoro Kos, DO     CS/MEDQ  D:  04/21/2014  T:  04/21/2014  Job:  [id number]

## 2014-04-28 DIAGNOSIS — L89104 Pressure ulcer of unspecified part of back, stage 4: Secondary | ICD-10-CM | POA: Diagnosis not present

## 2014-04-28 DIAGNOSIS — M40209 Unspecified kyphosis, site unspecified: Secondary | ICD-10-CM | POA: Diagnosis not present

## 2014-05-19 ENCOUNTER — Encounter (HOSPITAL_BASED_OUTPATIENT_CLINIC_OR_DEPARTMENT_OTHER): Payer: Medicare Other | Attending: Plastic Surgery

## 2014-05-19 DIAGNOSIS — L89109 Pressure ulcer of unspecified part of back, unspecified stage: Secondary | ICD-10-CM | POA: Diagnosis not present

## 2014-05-20 NOTE — Progress Notes (Signed)
[facility]  NAME:  Stephanie Bruce, Stephanie Bruce                  ACCOUNT NO.:  [id number]  MEDICAL RECORD NO.:  [id number]      DATE OF BIRTH:  1925/02/21  PHYSICIAN:  Theodoro Kos, DO       VISIT DATE:  05/19/2014                                  OFFICE VISIT   The patient is here for followup on her back pressure ulcer.  She has been using the [facility] and her son is here with her, he gives the history. He says he has been giving her boost and trying to increase her protein. He has also been offloading her.  She is pleasant, but history is from the son.  The area looks very clean.  No sign of infection.  No nonviable tissue.  It is nice and pink and seems to be granulating.  An Endoform was placed.  Her breathing is unlabored.  Her heart rate is regular.  We will continue with the [facility] with twice a week dressing changes and we will see her back in 3 weeks.     Theodoro Kos, DO     CS/MEDQ  D:  05/19/2014  T:  05/20/2014  Job:  [id number]

## 2014-06-09 ENCOUNTER — Encounter (HOSPITAL_BASED_OUTPATIENT_CLINIC_OR_DEPARTMENT_OTHER): Payer: Medicare Other | Attending: Plastic Surgery

## 2014-06-09 DIAGNOSIS — L89109 Pressure ulcer of unspecified part of back, unspecified stage: Secondary | ICD-10-CM | POA: Diagnosis present

## 2014-06-09 NOTE — Progress Notes (Signed)
[facility]  NAME:  AILISH, PROSPERO                  ACCOUNT NO.:  [id number]  MEDICAL RECORD NO.:  [id number]      DATE OF BIRTH:  [birth year]  PHYSICIAN:  Theodoro Kos, DO       VISIT DATE:  06/09/2014                                  OFFICE VISIT   The patient is an 80 year old female who is here with her son for followup on her back ulcer.  She was using the [facility] and it was discontinued due to insurance issues, so she is now getting collagen. She is actually looking better.  There is no change in her medications or social history.  Her son is still at home with her.  She is alert but not communicative and the son answers the questions.  REVIEW OF SYSTEMS:  Otherwise negative.  On exam, she is stable.  Breathing is unlabored.  Her heart rate is regular.  The wound is showing signs of improvement.  There is less depth and improvement in the granulation tissue and epithelialization particularly at the inferior portion of the wound.  Recommendation is to continue with collagen, offloading multivitamin, vitamin C, zinc, and add protein shake to the day, so she will be getting few each day and we will see her back in 1 month.     Theodoro Kos, DO     CS/MEDQ  D:  06/09/2014  T:  06/09/2014  Job:  [id number]

## 2014-06-30 DIAGNOSIS — L89109 Pressure ulcer of unspecified part of back, unspecified stage: Secondary | ICD-10-CM | POA: Diagnosis not present

## 2014-08-04 ENCOUNTER — Encounter (HOSPITAL_BASED_OUTPATIENT_CLINIC_OR_DEPARTMENT_OTHER): Payer: Medicare Other | Attending: Plastic Surgery

## 2014-08-04 DIAGNOSIS — L89124 Pressure ulcer of left upper back, stage 4: Secondary | ICD-10-CM | POA: Diagnosis not present

## 2014-08-05 NOTE — Progress Notes (Signed)
[facility]  NAME:  Stephanie Bruce, Stephanie Bruce                       ACCOUNT NO.:  MEDICAL RECORD NO.:  [id number]      DATE OF BIRTH:  07/18/1924  PHYSICIAN:  Theodoro Kos, DO       VISIT DATE:  08/04/2014                                  OFFICE VISIT   The patient is an 81 year old female, who is here with her son for followup on her back pressure ulcer.  She has been improving with a little bit more protein and rotating; however, she is spending a great bit of time in the chair without being repositioned, so that it is a little concerning but there is no change in her medications or social history.  She has been using collagen on the wound area.  On exam, she is alert, but not communicative.  The son answers the questions.  She is not in any acute distress.  Her breathing is unlabored.  Her heart rate is regular.  The wound is showing remarkable signs of improvement, filling in and getting smaller, so we talked about repositioning even when in the chair.  To continue with protein and vitamins.  To continue with collagen changing daily, and we will see her back in 1 month.     Theodoro Kos, DO     CS/MEDQ  D:  08/04/2014  T:  08/05/2014  Job:  [id number]

## 2014-09-01 DIAGNOSIS — L89124 Pressure ulcer of left upper back, stage 4: Secondary | ICD-10-CM | POA: Diagnosis not present

## 2014-11-10 ENCOUNTER — Inpatient Hospital Stay (HOSPITAL_COMMUNITY)
Admission: EM | Admit: 2014-11-10 | Discharge: 2014-11-12 | DRG: 444 | Disposition: A | Discharge status: Home / Self Care | Payer: Medicare Other | Attending: Internal Medicine | Admitting: Internal Medicine

## 2014-11-10 ENCOUNTER — Emergency Department (HOSPITAL_COMMUNITY): Payer: Medicare Other

## 2014-11-10 ENCOUNTER — Inpatient Hospital Stay (HOSPITAL_COMMUNITY): Payer: Medicare Other

## 2014-11-10 ENCOUNTER — Encounter (HOSPITAL_COMMUNITY): Payer: Self-pay | Admitting: Emergency Medicine

## 2014-11-10 ENCOUNTER — Ambulatory Visit (HOSPITAL_COMMUNITY): Payer: Medicare Other

## 2014-11-10 DIAGNOSIS — K811 Chronic cholecystitis: Secondary | ICD-10-CM | POA: Insufficient documentation

## 2014-11-10 DIAGNOSIS — R4182 Altered mental status, unspecified: Secondary | ICD-10-CM | POA: Diagnosis not present

## 2014-11-10 DIAGNOSIS — Z85828 Personal history of other malignant neoplasm of skin: Secondary | ICD-10-CM

## 2014-11-10 DIAGNOSIS — F0390 Unspecified dementia without behavioral disturbance: Secondary | ICD-10-CM | POA: Diagnosis present

## 2014-11-10 DIAGNOSIS — R1111 Vomiting without nausea: Secondary | ICD-10-CM

## 2014-11-10 DIAGNOSIS — F0391 Unspecified dementia with behavioral disturbance: Secondary | ICD-10-CM | POA: Diagnosis not present

## 2014-11-10 DIAGNOSIS — I69320 Aphasia following cerebral infarction: Secondary | ICD-10-CM

## 2014-11-10 DIAGNOSIS — E440 Moderate protein-calorie malnutrition: Secondary | ICD-10-CM | POA: Diagnosis present

## 2014-11-10 DIAGNOSIS — J449 Chronic obstructive pulmonary disease, unspecified: Secondary | ICD-10-CM | POA: Diagnosis present

## 2014-11-10 DIAGNOSIS — R1110 Vomiting, unspecified: Secondary | ICD-10-CM | POA: Insufficient documentation

## 2014-11-10 DIAGNOSIS — K8012 Calculus of gallbladder with acute and chronic cholecystitis without obstruction: Principal | ICD-10-CM | POA: Diagnosis present

## 2014-11-10 DIAGNOSIS — L89153 Pressure ulcer of sacral region, stage 3: Secondary | ICD-10-CM | POA: Diagnosis present

## 2014-11-10 DIAGNOSIS — R7989 Other specified abnormal findings of blood chemistry: Secondary | ICD-10-CM | POA: Diagnosis present

## 2014-11-10 DIAGNOSIS — R740 Nonspecific elevation of levels of transaminase and lactic acid dehydrogenase [LDH]: Secondary | ICD-10-CM

## 2014-11-10 DIAGNOSIS — E785 Hyperlipidemia, unspecified: Secondary | ICD-10-CM | POA: Diagnosis present

## 2014-11-10 DIAGNOSIS — I10 Essential (primary) hypertension: Secondary | ICD-10-CM | POA: Insufficient documentation

## 2014-11-10 DIAGNOSIS — R7401 Elevation of levels of liver transaminase levels: Secondary | ICD-10-CM

## 2014-11-10 DIAGNOSIS — R404 Transient alteration of awareness: Secondary | ICD-10-CM

## 2014-11-10 DIAGNOSIS — R945 Abnormal results of liver function studies: Secondary | ICD-10-CM

## 2014-11-10 LAB — COMPREHENSIVE METABOLIC PANEL
ALK PHOS: 112 U/L (ref 38–126)
ALT: 476 U/L — AB (ref 14–54)
AST: 1292 U/L — ABNORMAL HIGH (ref 15–41)
Albumin: 3.4 g/dL — ABNORMAL LOW (ref 3.5–5.0)
Anion gap: 11 (ref 5–15)
BILIRUBIN TOTAL: 1.6 mg/dL — AB (ref 0.3–1.2)
BUN: 9 mg/dL (ref 6–20)
CO2: 27 mmol/L (ref 22–32)
Calcium: 9.2 mg/dL (ref 8.9–10.3)
Chloride: 103 mmol/L (ref 101–111)
Creatinine, Ser: 0.84 mg/dL (ref 0.44–1.00)
GFR calc Af Amer: >60 mL/min (ref >60)
Glucose, Bld: 228 mg/dL — ABNORMAL HIGH (ref 70–99)
Potassium: 3.9 mmol/L (ref 3.5–5.1)
SODIUM: 141 mmol/L (ref 135–145)
Total Protein: 7.1 g/dL (ref 6.5–8.1)

## 2014-11-10 LAB — URINALYSIS, ROUTINE W REFLEX MICROSCOPIC
BILIRUBIN URINE: NEGATIVE
Glucose, UA: 500 mg/dL — AB
Ketones, ur: NEGATIVE mg/dL
Leukocytes, UA: NEGATIVE
Nitrite: NEGATIVE
Protein, ur: NEGATIVE mg/dL
SPECIFIC GRAVITY, URINE: 1.014 (ref 1.005–1.030)
UROBILINOGEN UA: 1 mg/dL (ref 0.0–1.0)
pH: 7 (ref 5.0–8.0)

## 2014-11-10 LAB — DIFFERENTIAL
Basophils Absolute: 0 10*3/uL (ref 0.0–0.1)
Basophils Relative: 0 % (ref 0–1)
EOS ABS: 0 10*3/uL (ref 0.0–0.7)
Eosinophils Relative: 0 % (ref 0–5)
Lymphocytes Relative: 10 % — ABNORMAL LOW (ref 12–46)
Lymphs Abs: 1.1 10*3/uL (ref 0.7–4.0)
Monocytes Absolute: 0.7 10*3/uL (ref 0.1–1.0)
Monocytes Relative: 7 % (ref 3–12)
NEUTROS ABS: 8.7 10*3/uL — AB (ref 1.7–7.7)
Neutrophils Relative %: 83 % — ABNORMAL HIGH (ref 43–77)

## 2014-11-10 LAB — HEPATIC FUNCTION PANEL
ALBUMIN: 2.8 g/dL — AB (ref 3.5–5.0)
ALT: 378 U/L — ABNORMAL HIGH (ref 14–54)
AST: 668 U/L — ABNORMAL HIGH (ref 15–41)
Alkaline Phosphatase: 100 U/L (ref 38–126)
BILIRUBIN DIRECT: 1 mg/dL — AB (ref 0.1–0.5)
BILIRUBIN INDIRECT: 0.8 mg/dL (ref 0.3–0.9)
BILIRUBIN TOTAL: 1.8 mg/dL — AB (ref 0.3–1.2)
Total Protein: 6.1 g/dL — ABNORMAL LOW (ref 6.5–8.1)

## 2014-11-10 LAB — URINE MICROSCOPIC-ADD ON

## 2014-11-10 LAB — AMMONIA: AMMONIA: 37 umol/L — AB (ref 9–35)

## 2014-11-10 LAB — I-STAT CHEM 8, ED
BUN: 13 mg/dL (ref 6–20)
CREATININE: 0.7 mg/dL (ref 0.44–1.00)
Calcium, Ion: 1.15 mmol/L (ref 1.13–1.30)
Chloride: 100 mmol/L — ABNORMAL LOW (ref 101–111)
GLUCOSE: 242 mg/dL — AB (ref 70–99)
HCT: 51 % — ABNORMAL HIGH (ref 36.0–46.0)
HEMOGLOBIN: 17.3 g/dL — AB (ref 12.0–15.0)
Potassium: 4.4 mmol/L (ref 3.5–5.1)
SODIUM: 143 mmol/L (ref 135–145)
TCO2: 26 mmol/L (ref 0–100)

## 2014-11-10 LAB — APTT: APTT: 25 s (ref 24–37)

## 2014-11-10 LAB — CBC
HCT: 46.3 % — ABNORMAL HIGH (ref 36.0–46.0)
Hemoglobin: 15.8 g/dL — ABNORMAL HIGH (ref 12.0–15.0)
MCH: 31.5 pg (ref 26.0–34.0)
MCHC: 34.1 g/dL (ref 30.0–36.0)
MCV: 92.4 fL (ref 78.0–100.0)
PLATELETS: 279 10*3/uL (ref 150–400)
RBC: 5.01 MIL/uL (ref 3.87–5.11)
RDW: 14.3 % (ref 11.5–15.5)
WBC: 10.5 10*3/uL (ref 4.0–10.5)

## 2014-11-10 LAB — RAPID URINE DRUG SCREEN, HOSP PERFORMED
Amphetamines: NOT DETECTED
BARBITURATES: NOT DETECTED
Benzodiazepines: NOT DETECTED
Cocaine: NOT DETECTED
OPIATES: NOT DETECTED
TETRAHYDROCANNABINOL: NOT DETECTED

## 2014-11-10 LAB — I-STAT TROPONIN, ED: Troponin i, poc: 0.06 ng/mL (ref 0.00–0.08)

## 2014-11-10 LAB — PROTIME-INR
INR: 1.01 (ref 0.00–1.49)
PROTHROMBIN TIME: 13.4 s (ref 11.6–15.2)

## 2014-11-10 LAB — CBG MONITORING, ED: Glucose-Capillary: 219 mg/dL — ABNORMAL HIGH (ref 70–99)

## 2014-11-10 LAB — ETHANOL: Alcohol, Ethyl (B): <5 mg/dL (ref <5)

## 2014-11-10 MED ORDER — ADULT MULTIVITAMIN W/MINERALS CH
1.0000 | ORAL_TABLET | Freq: Every day | ORAL | Status: DC
  Administered 2014-11-12: 1 via ORAL
  Filled 2014-11-10 (×3): qty 1

## 2014-11-10 MED ORDER — SODIUM CHLORIDE 0.9 % IJ SOLN
3.0000 mL | Freq: Two times a day (BID) | INTRAMUSCULAR | Status: DC
  Administered 2014-11-10 – 2014-11-11 (×3): 3 mL via INTRAVENOUS

## 2014-11-10 MED ORDER — ACETAMINOPHEN 650 MG RE SUPP
650.0000 mg | RECTAL | Status: DC | PRN
  Administered 2014-11-10: 650 mg via RECTAL
  Filled 2014-11-10: qty 1

## 2014-11-10 MED ORDER — SODIUM CHLORIDE 0.9 % IJ SOLN: 3.0000 mL | INTRAMUSCULAR | Status: DC | PRN

## 2014-11-10 MED ORDER — ASPIRIN EC 81 MG PO TBEC: 81.0000 mg | DELAYED_RELEASE_TABLET | Freq: Every day | ORAL | Status: DC

## 2014-11-10 MED ORDER — CIPROFLOXACIN IN D5W 400 MG/200ML IV SOLN
400.0000 mg | Freq: Two times a day (BID) | INTRAVENOUS | Status: DC
  Administered 2014-11-10: 400 mg via INTRAVENOUS
  Filled 2014-11-10: qty 200

## 2014-11-10 MED ORDER — SENNOSIDES-DOCUSATE SODIUM 8.6-50 MG PO TABS: 1.0000 | ORAL_TABLET | Freq: Every evening | ORAL | Status: DC | PRN

## 2014-11-10 MED ORDER — ASPIRIN 300 MG RE SUPP
300.0000 mg | Freq: Every day | RECTAL | Status: DC
  Administered 2014-11-10 – 2014-11-11 (×2): 300 mg via RECTAL
  Filled 2014-11-10 (×2): qty 1

## 2014-11-10 MED ORDER — SODIUM CHLORIDE 0.9 % IV SOLN: 250.0000 mL | INTRAVENOUS | Status: DC | PRN

## 2014-11-10 MED ORDER — ACETAMINOPHEN 325 MG PO TABS: 650.0000 mg | ORAL_TABLET | ORAL | Status: DC | PRN

## 2014-11-10 MED ORDER — HEPARIN SODIUM (PORCINE) 5000 UNIT/ML IJ SOLN
5000.0000 [IU] | Freq: Three times a day (TID) | INTRAMUSCULAR | Status: DC
  Administered 2014-11-10 – 2014-11-12 (×5): 5000 [IU] via SUBCUTANEOUS
  Filled 2014-11-10 (×5): qty 1

## 2014-11-10 MED ORDER — SODIUM CHLORIDE 0.9 % IV SOLN
INTRAVENOUS | Status: DC
  Administered 2014-11-10 – 2014-11-11 (×2): via INTRAVENOUS

## 2014-11-10 MED ORDER — STROKE: EARLY STAGES OF RECOVERY BOOK
Freq: Once | Status: AC
  Administered 2014-11-10: 18:00:00

## 2014-11-10 MED ORDER — ONDANSETRON HCL 4 MG/2ML IJ SOLN: 4.0000 mg | Freq: Three times a day (TID) | INTRAMUSCULAR | Status: AC | PRN

## 2014-11-10 NOTE — Consult Note (Signed)
Neurology Consultation Reason for Consult: Episode Referring Physician: Sabra Heck, B  CC: unresponsiveness  History is obtained from:patient's sons  HPI: Stephanie Bruce is a 81 y.o. female with a history of previous stroke who was eating breakfast this morning and began vomiting profusely. Following this, she was awake, but unresponsive for about 5 minutes. She subsequently was noted to have eye deviation to the left and increased facial drooping on the right. This has been steadily improving. At baseline, she is severely aphasic, but is able to answer "yes" to some questions and follow some commands.    LKW: 8:30 am tpa given?: no, rapidly improving symptoms.     ROS: Unable to obtain due to altered mental status.   Past Medical History  Diagnosis Date  . Stroke   . Arthritis   . Hypertension   . Cancer     skin   . History of fractured rib   . Bronchitis     History of  . COPD (chronic obstructive pulmonary disease)   . Personal history of kidney stones   . H/O: GI bleed   . Constipation   . History of syncope   . Bruises easily   . Hyperlipidemia     Family History: Unable to assess secondary to patient's altered mental status.    Social History: Unable to assess secondary to patient's altered mental status.    Exam: Current vital signs: There were no vitals taken for this visit. Vital signs in last 24 hours:     Physical Exam  Constitutional: Appears elderly and chronically ill Psych: does not respond to questions Eyes: No scleral injection HENT: No OP obstrucion Head: Normocephalic.  Cardiovascular: Normal rate and regular rhythm.  Respiratory: Effort normal  GI: Soft.   Skin: WDI  Neuro: Mental Status: Patient is awake, alert, does not answer questions or follow commands.  Cranial Nerves: II: blinks to threat bilaterally.  III,IV, VI: tracks across midline in both directions.  V: responds to stimuli bilaterally VII: Facial movement is symmetric.   VIII, X, XI, XII: Unable to assess secondary to patient's altered mental status.  Motor: Tone is increased throughout the right side. She has a spastic hemiparesis with some proximal movement of the right arm, right hand is clenched. Right leg with mor movement than arm, 3 - 4 /5 with significant spasticity.  Sensory: Responds to nox stim x 4.  Deep Tendon Reflexes: 2+ on the right, 1+ on the left.  Cerebellar: Unable to assess secondary to patient's altered mental status.    I have reviewed labs in epic and the results pertinent to this consultation are: Chem 8 - unremarkable.    Impression: 81 yo F with transient alteration of awareness following emesis. Possibilities include seizure, vagal episode caused by emesis, less likely acute stroke. IVH would be possible as well and will need to be ruled out with head CT.  I discussed tpa with the family, and with the improvements that patient is making and degree of symptoms at baseline, we will not proceed down this direction.   Recommendations: 1) CT head to assess for ICH 2) If negative, would pursue MRI brain w/o contrast and EEG.  3) neurology will continue to follow.   Roland Rack, MD [facility] [phone]  If 7pm- 7am, please page neurology on call as listed in [facility].

## 2014-11-10 NOTE — Progress Notes (Signed)
Patient arrived to room via stretcher accompanied by two sons. Safety precautions and orders reviewed with patient/family. TELE applied and confirmed with Aleisha. MD paged ubpon patient's arrival. Patient appears in no distress. Will continue to monitor.  Ave Filter, RN

## 2014-11-10 NOTE — ED Notes (Signed)
Per patient son patient was eating breakfast began vomiting. Was unresponsive for a short period of time. Also leaning toward the left. Pt with previous hx of stroke. Pt nonverbal from previous stroke.
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
## 2014-11-10 NOTE — Progress Notes (Signed)
SLP Cancellation Note  Patient Details Name: Stephanie Bruce MRN: [id number] DOB: 1925-03-22   Cancelled treatment:       Reason Eval/Treat Not Completed: Patient at procedure or test/unavailable, will return as able.   [name], M.A. CCC-SLP [phone]  [name] 11/10/2014, 3:25 PM

## 2014-11-10 NOTE — Progress Notes (Signed)
Pt has compromised scalp with very dry, sensitive skin; when tech attempted to gently exfoliate scalp for EEG, patient's skin became raw. Called Dr Leonel Ramsay and advised him of her skin's condition. He advised to cancel EEG.
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
## 2014-11-11 ENCOUNTER — Inpatient Hospital Stay (HOSPITAL_COMMUNITY): Payer: Medicare Other

## 2014-11-11 DIAGNOSIS — R1111 Vomiting without nausea: Secondary | ICD-10-CM

## 2014-11-11 DIAGNOSIS — R945 Abnormal results of liver function studies: Secondary | ICD-10-CM

## 2014-11-11 DIAGNOSIS — K811 Chronic cholecystitis: Secondary | ICD-10-CM

## 2014-11-11 DIAGNOSIS — R7989 Other specified abnormal findings of blood chemistry: Secondary | ICD-10-CM | POA: Insufficient documentation

## 2014-11-11 DIAGNOSIS — E440 Moderate protein-calorie malnutrition: Secondary | ICD-10-CM | POA: Insufficient documentation

## 2014-11-11 DIAGNOSIS — R1110 Vomiting, unspecified: Secondary | ICD-10-CM | POA: Insufficient documentation

## 2014-11-11 LAB — LIPID PANEL
Cholesterol: 181 mg/dL (ref 0–200)
HDL: 39 mg/dL — AB (ref >40)
LDL Cholesterol: 125 mg/dL — ABNORMAL HIGH (ref 0–99)
TRIGLYCERIDES: 84 mg/dL (ref <150)
Total CHOL/HDL Ratio: 4.6 RATIO
VLDL: 17 mg/dL (ref 0–40)

## 2014-11-11 LAB — COMPREHENSIVE METABOLIC PANEL
ALBUMIN: 2.8 g/dL — AB (ref 3.5–5.0)
ALK PHOS: 109 U/L (ref 38–126)
ALT: 294 U/L — ABNORMAL HIGH (ref 14–54)
AST: 388 U/L — ABNORMAL HIGH (ref 15–41)
Anion gap: 12 (ref 5–15)
BILIRUBIN TOTAL: 2.1 mg/dL — AB (ref 0.3–1.2)
BUN: 9 mg/dL (ref 6–20)
CHLORIDE: 108 mmol/L (ref 101–111)
CO2: 21 mmol/L — ABNORMAL LOW (ref 22–32)
Calcium: 8.5 mg/dL — ABNORMAL LOW (ref 8.9–10.3)
Creatinine, Ser: 0.74 mg/dL (ref 0.44–1.00)
GFR calc Af Amer: >60 mL/min (ref >60)
GFR calc non Af Amer: >60 mL/min (ref >60)
Glucose, Bld: 85 mg/dL (ref 70–99)
POTASSIUM: 4.7 mmol/L (ref 3.5–5.1)
Sodium: 141 mmol/L (ref 135–145)
Total Protein: 5.8 g/dL — ABNORMAL LOW (ref 6.5–8.1)

## 2014-11-11 LAB — CBC
HCT: 38.7 % (ref 36.0–46.0)
Hemoglobin: 13 g/dL (ref 12.0–15.0)
MCH: 31 pg (ref 26.0–34.0)
MCHC: 33.6 g/dL (ref 30.0–36.0)
MCV: 92.4 fL (ref 78.0–100.0)
Platelets: 265 10*3/uL (ref 150–400)
RBC: 4.19 MIL/uL (ref 3.87–5.11)
RDW: 14.6 % (ref 11.5–15.5)
WBC: 9.5 10*3/uL (ref 4.0–10.5)

## 2014-11-11 LAB — LIPASE, BLOOD: Lipase: 18 U/L — ABNORMAL LOW (ref 22–51)

## 2014-11-11 MED ORDER — MORPHINE SULFATE 4 MG/ML IJ SOLN
INTRAMUSCULAR | Status: AC
  Filled 2014-11-11: qty 1

## 2014-11-11 MED ORDER — MORPHINE SULFATE 4 MG/ML IJ SOLN
2.3600 mg | Freq: Once | INTRAMUSCULAR | Status: AC
  Administered 2014-11-11: 2.36 mg via INTRAVENOUS

## 2014-11-11 MED ORDER — TECHNETIUM TC 99M MEBROFENIN IV KIT
5.0000 | PACK | Freq: Once | INTRAVENOUS | Status: AC | PRN
  Administered 2014-11-11: 5 via INTRAVENOUS

## 2014-11-11 MED ORDER — ENSURE ENLIVE PO LIQD
237.0000 mL | Freq: Two times a day (BID) | ORAL | Status: DC
  Administered 2014-11-11 – 2014-11-12 (×2): 237 mL via ORAL

## 2014-11-11 MED ORDER — ASPIRIN EC 81 MG PO TBEC
81.0000 mg | DELAYED_RELEASE_TABLET | Freq: Every day | ORAL | Status: DC
  Administered 2014-11-12: 81 mg via ORAL
  Filled 2014-11-11: qty 1

## 2014-11-11 MED ORDER — CIPROFLOXACIN HCL 500 MG PO TABS
500.0000 mg | ORAL_TABLET | Freq: Two times a day (BID) | ORAL | Status: DC
  Administered 2014-11-11 – 2014-11-12 (×2): 500 mg via ORAL
  Filled 2014-11-11 (×2): qty 1

## 2014-11-11 NOTE — Evaluation (Signed)
Occupational Therapy Evaluation Patient Details Name: Stephanie Bruce MRN: [id number] DOB: [birth year] Today's Date: 11/11/2014    History of Present Illness 81 yo female from home with x2 son (A) with care. presents with vomitting and AMS.Ultrasound reveals gallstones. PMH: dementia, CVA , HTN, COPD, back surg,     Clinical Impression   Patient evaluated by Occupational Therapy with no further acute OT needs identified. All education has been completed and the patient has no further questions. See below for any follow-up Occupational Therapy or equipment needs. OT to sign off. Thank you for referral.   Son provided handout on how to purchase carrot palm guard. Pt has verbalized using wash clothes and cutting them up at home. Son thankful and expresses gratitude to OT.   Recommending mattress overlay due to skin break down noted on spine. Pt with redness and risk for break down in peri area due to incontinence.      Follow Up Recommendations  Other (comment) (plans to d/c home with sons at total (A) levle)    [facility] bed;Other (comment) (mattress overlay)    Recommendations for Other Services       Precautions / Restrictions Precautions Precautions: Fall Precaution Comments: pt with stage 3 wound on back,       Mobility Bed Mobility Overal bed mobility: +2 for physical assistance;+ 2 for safety/equipment;Needs Assistance Bed Mobility: Supine to Sit;Sit to Supine     Supine to sit: Total assist;+2 for physical assistance;+2 for safety/equipment Sit to supine: Total assist;+2 for physical assistance;+2 for safety/equipment   General bed mobility comments: pt unable to assist. Pt void of bladder and bowel with lack of awareness  Transfers Overall transfer level: Needs assistance Equipment used: 2 person hand held assist Transfers: Sit to/from Stand Sit to Stand: Total assist;+2 physical assistance;+2 safety/equipment         General transfer  comment: pt with a flexed posture total (A) unable to weigth bear on L LE    Balance Overall balance assessment: Needs assistance Sitting-balance support: No upper extremity supported;Feet supported Sitting balance-Leahy Scale: Zero       Standing balance-Leahy Scale: Zero                              ADL Overall ADL's : At baseline                                       General ADL Comments: total (A) for adls     Vision     Perception     Praxis      Pertinent Vitals/Pain Pain Assessment: No/denies pain     Hand Dominance     Extremity/Trunk Assessment Upper Extremity Assessment Upper Extremity Assessment: RUE deficits/detail;LUE deficits/detail RUE Deficits / Details: hand contracture with ability to range PIP 5 degrees to access palm but mcp contracted in flexion.  (flexed digits, flexed wrist, elbow flexion ( flexion synergy) LUE Deficits / Details: FULL of hand and wrist and elbow   Lower Extremity Assessment Lower Extremity Assessment: Defer to PT evaluation;LLE deficits/detail LLE Deficits / Details: contractures with external rotation of hips with noted bil knee pressure spot noted   Cervical / Trunk Assessment Cervical / Trunk Assessment: Kyphotic   Communication Communication Communication: No difficulties   Cognition Arousal/Alertness: Awake/alert Behavior During Therapy: Flat affect Overall Cognitive Status:  History of cognitive impairments - at baseline                     General Comments       Exercises       Shoulder Instructions      Home Living Family/patient expects to be discharged to:: Private residence Living Arrangements: Children Available Help at Discharge: Family Type of Home: House                       Home Equipment: Wheelchair - manual          Prior Functioning/Environment Level of Independence: Needs assistance  Gait / Transfers Assistance Needed: does not walk,  total (A) transfer only to w/c or chair ADL's / Homemaking Assistance Needed:  total (A) supine in bed        OT Diagnosis: Generalized weakness;Cognitive deficits   OT Problem List:     OT Treatment/Interventions:      OT Goals(Current goals can be found in the care plan section)    OT Frequency:     Barriers to D/C:            Co-evaluation              End of Session Equipment Utilized During Treatment: Gait belt Nurse Communication: Mobility status;Precautions;Need for lift equipment  Activity Tolerance: Patient tolerated treatment well Patient left: in bed;with call bell/phone within reach;with family/visitor present   Time: 4536-4680 OT Time Calculation (min): 21 min Charges:  OT General Charges $OT Visit: 1 Procedure OT Evaluation $Initial OT Evaluation Tier I: 1 Procedure G-Codes:    Peri Maris 2014/12/07, 12:24 PM  Pager: [phone]

## 2014-11-11 NOTE — Progress Notes (Signed)
SLP Cancellation Note  Patient Details Name: Stephanie Bruce MRN: [id number] DOB: 10-Oct-1924   Cancelled treatment:       Reason Eval/Treat Not Completed: SLP screened, no needs identified, will sign off. Per son, pt com/cog status is at baseline. Pt currently nonvocal, does not follow commands, and makes no attempt to communicate.   BSE was completed. Please refer to report for results and recommendations.  Mouhamad Teed B. Frank, [facility], [place]  Shonna Chock 11/11/2014, 2:16 PM

## 2014-11-11 NOTE — Progress Notes (Signed)
[facility] PROGRESS NOTE  TIYANA GALLA VCB:[id number] DOB: 03/15/1925 DOA: 11/10/2014 PCP: No primary care provider on file.  Assessment/Plan: 1-AMS, transient: in patient with moderate to severe dementia  -possible vasovagal from nausea/vomiting event vs TIA/stroke vs seizure vs infection -no further event appreciated since admission -MRI limited, but negative for acute stroke -patient with most likely acute on chronic cholecystitis  -plan is to treat with PO antibiotics -follow clinical response -per family members now is back to baseline -no AED recommended by neurology  2-acute on chronic cholecystitis: transiet episode of fever and N/V -now resolved abd [place] suggesting acute cholecystitis and demonstrating cholelithiasis  -normal WBC's, LFT's trending down, normal HID scan, no further fever and tolerating PO dysphagia 1 diet -will follow surgery rec's and will treat with cipro for 14 days -no anticipation for surgery or needs for cholecystostomy at this point -high risk candidate for surgical interventions  3-Transaminitis: most likely after passing a gallstone -resolving/trending down -continue supportive care -will follow LFT trend intermittently  4-dementia: back to baseline now according to son at bedside  5-moderate protein calorie malnutrition: will use ensure BID and daily MV -dietitian consulted  6-DVT: heparin  Code Status: Full Family Communication: son at bedside Disposition Plan: to be determine; most likely home with family care and [facility] services.   Consultants:  [facility]  Neurology   Procedures:  HIDA scan: normal gallbladder uptake and excretion of biliary tracer (suggesting patent cystic duct)  abd [place]: 1. Distended gallbladder with wall thickening and multiple small stones. Consider acute cholecystitis in the proper clinical setting. 2. Echogenic liver consistent with hepatic steatosis. 3. No bile duct dilation. 4. Pancreas not visualized.  Small pleural effusions. Mild ectasia of the infrarenal abdominal aorta.  Antibiotics:  cipro (plan is to treat for 14 days)  HPI/Subjective: Afebrile, no further vomiting, nausea or abd pain. HIDA scan is normal. Normal WBC's   Objective: Filed Vitals:   11/11/14 1400  BP: 135/72  Pulse: 80  Temp: 98.1 F (36.7 C)  Resp: 16    Intake/Output Summary (Last 24 hours) at 11/11/14 1751 Last data filed at 11/11/14 1319  Gross per 24 hour  Intake      0 ml  Output      0 ml  Net      0 ml   There were no vitals filed for this visit.  Exam:   General:  Afebrile, no further complaints of nausea or vomiting as per family reports; patient non verbal and w/o any acute distress   Cardiovascular: RRR, no rubs or gallops  Respiratory: CTA bilaterally  Abdomen: soft, no guarding; no grimacing with palpation; positive BS  Musculoskeletal: no edema, no cyanosis. Patient with certain limbs contractions (not new)  Data Reviewed: Basic Metabolic Panel:  Recent Labs Lab 11/10/14 0959 11/10/14 1014 11/11/14 0551  NA 143 141 141  K 4.4 3.9 4.7  CL 100* 103 108  CO2  --  27 21*  GLUCOSE 242* 228* 85  BUN 13 9 9   CREATININE 0.70 0.84 0.74  CALCIUM  --  9.2 8.5*   Liver Function Tests:  Recent Labs Lab 11/10/14 1014 11/10/14 1907 11/11/14 0551  AST 1292* 668* 388*  ALT 476* 378* 294*  ALKPHOS 112 100 109  BILITOT 1.6* 1.8* 2.1*  PROT 7.1 6.1* 5.8*  ALBUMIN 3.4* 2.8* 2.8*    Recent Labs Lab 11/11/14 1000  LIPASE 18*    Recent Labs Lab 11/10/14 1907  AMMONIA 37*   CBC:  Recent Labs Lab 11/10/14 0959 11/10/14 1014 11/11/14 0717  WBC  --  10.5 9.5  NEUTROABS  --  8.7*  --   HGB 17.3* 15.8* 13.0  HCT 51.0* 46.3* 38.7  MCV  --  92.4 92.4  PLT  --  279 265   CBG:  Recent Labs Lab 11/10/14 0946  GLUCAP 219*   Studies: Dg Chest 2 View  11/10/2014   CLINICAL DATA:  Altered mental status, hx of stroke causing pt to become non verbal, hx  hypertension, hx copd  EXAM: CHEST  2 VIEW  COMPARISON:  05/16/2011.  09/10/2013.  FINDINGS: Cardiac silhouette is mildly enlarged. No mediastinal or hilar masses or evidence of adenopathy.  Minimal pleural effusions. Minor medial lung base subsegmental atelectasis. No lung consolidation or edema. Lungs are hyperexpanded. No pneumothorax.  Bony thorax is diffusely demineralized.  IMPRESSION: 1. No acute cardiopulmonary disease. 2. Minimal pleural effusions. Minor lung base subsegmental atelectasis.   Electronically Signed   By: Lajean Manes M.D.   On: 11/10/2014 15:52   Ct Head Wo Contrast  11/10/2014   CLINICAL DATA:  Acute onset altered mental status with eyes deviated toward the left and right-sided facial droop  EXAM: CT HEAD WITHOUT CONTRAST  TECHNIQUE: Contiguous axial images were obtained from the base of the skull through the vertex without intravenous contrast.  COMPARISON:  May 16, 2011  FINDINGS: Moderate diffuse atrophy is stable. There is a calcification arising from the dura of the right frontal lobe measuring 1.0 x 0.7 cm consistent with a small meningioma, stable and not felt to have clinical significance. There is no surrounding edema or mass effect from this small calcification. There is no other evidence suggesting mass. There is no hemorrhage, extra-axial fluid collection, or midline shift. There is decreased attenuation surrounding the lateral ventricles, consistent with either interstitial edema from the ventricular enlargement or small vessel disease. These changes are stable. There is no new gray-white compartment lesion. No acute infarct apparent. The bony calvarium appears intact. The mastoid air cells are clear.  IMPRESSION: Atrophy with probable periventricular small vessel disease, stable. No intracranial hemorrhage or mass effect. Small calcified meningioma in the left frontal region not felt to have clinical significance. This is a stable finding. No evidence suggesting acute  infarct.   Electronically Signed   By: Lowella Grip III M.D.   On: 11/10/2014 10:58   Mr Brain Wo Contrast  11/10/2014   CLINICAL DATA:  81 yo F with transient alteration of awareness following emesis. Possibilities include seizure, vagal episode caused by emesis, less likely acute stroke. This occurred earlier today.  EXAM: MRI HEAD WITHOUT CONTRAST  TECHNIQUE: Multiplanar, multiecho pulse sequences of the brain and surrounding structures were obtained without intravenous contrast.  COMPARISON:  None.  FINDINGS: The patient was uncooperative for the exam. There is severe motion degradation. The exam was truncated prematurely by the patient who crawled out of the scanner. Only diffusion imaging and T1 weighted imaging are sufficiently free of motion to allow interpretation.  No areas of restricted diffusion are observed. No intracranial mass lesion is evident. There is severe atrophy with hydrocephalus ex vacuo. Assessment of intracranial vascular patency is not possible. Grossly unremarkable extracranial soft tissues. Suspected chronic hemorrhagic infarct LEFT parieto-occipital periventricular white matter.  IMPRESSION: Motion degraded and prematurely truncated exam. No visible acute stroke. Severe atrophy.   Electronically Signed   By: Rolla Flatten M.D.   On: 11/10/2014 15:11   Nm Hepatobiliary Including Gb  11/11/2014  CLINICAL DATA:  Elevated LFTs  EXAM: NUCLEAR MEDICINE HEPATOBILIARY IMAGING  TECHNIQUE: Sequential images of the abdomen were obtained out to 60 minutes following intravenous administration of radiopharmaceutical.  RADIOPHARMACEUTICALS:  5.3 mCi Tc-33m Choletec IV  COMPARISON:  11/10/2014  FINDINGS: There is opacification of the liver immediately following injection of the radiotracer. Visualization of the biliary tree is noted at 15 minutes with small bowel activity at 20 minutes. Progressive drainage into the small bowel is noted. Imaging was continued to 90 minutes without  visualization of the gallbladder. Patient was then administered 2.36 mg of morphine and after 25 minutes the gallbladder showed opacification.  IMPRESSION: Normal uptake and excretion of biliary tracer.  Changes consistent with a patent cystic duct.   Electronically Signed   By: Inez Catalina M.D.   On: 11/11/2014 14:26   US Abdomen Complete  11/10/2014   CLINICAL DATA:  Elevated liver function tests. Altered mental status.  EXAM: ULTRASOUND ABDOMEN COMPLETE  COMPARISON:  CT, 10/29/2006.  Ultrasound, 08/07/2010.  FINDINGS: Gallbladder: Multiple small partly shadowing gallstones, mobile. There is gallbladder wall thickening with edema along the hepatic side of the gallbladder wall. Sonographic Murphy's sign is unreliable in this patient. Wall measures 4.9 mm in thickness.  Common bile duct: Diameter: 4.4 mm.  No duct stone is seen.  Liver: Liver is echogenic. No discrete liver mass or lesion is seen. Hepatopetal flow documented in the portal vein.  IVC: No abnormality visualized.  Pancreas: Obscured by bowel gas.  Spleen: Size and appearance within normal limits.  Right Kidney: Length: 9.8 cm. Echogenicity within normal limits. No mass or hydronephrosis visualized.  Left Kidney: Length: 9.6 cm. Echogenicity within normal limits. No mass or hydronephrosis visualized.  Abdominal aorta: Small focal bulge in the infrarenal aorta measuring a maximum of 2.3 cm in greatest anterior-posterior dimension.  Other findings: Small bilateral pleural effusions.  IMPRESSION: 1. Distended gallbladder with wall thickening and multiple small stones. Consider acute cholecystitis in the proper clinical setting. 2. Echogenic liver consistent with hepatic steatosis. 3. No bile duct dilation. 4. Pancreas not visualized. Small pleural effusions. Mild ectasia of the infrarenal abdominal aorta.   Electronically Signed   By: Lajean Manes M.D.   On: 11/10/2014 16:04   Mr Jodene Nam Head/brain Wo Cm  11/10/2014   CLINICAL DATA:  81 yo F with  transient alteration of awareness following emesis. Possibilities include seizure, vagal episode caused by emesis, less likely acute stroke. This occurred earlier today.  EXAM: MRI HEAD WITHOUT CONTRAST  TECHNIQUE: Multiplanar, multiecho pulse sequences of the brain and surrounding structures were obtained without intravenous contrast.  COMPARISON:  None.  FINDINGS: The patient was uncooperative for the exam. There is severe motion degradation. The exam was truncated prematurely by the patient who crawled out of the scanner. Only diffusion imaging and T1 weighted imaging are sufficiently free of motion to allow interpretation.  No areas of restricted diffusion are observed. No intracranial mass lesion is evident. There is severe atrophy with hydrocephalus ex vacuo. Assessment of intracranial vascular patency is not possible. Grossly unremarkable extracranial soft tissues. Suspected chronic hemorrhagic infarct LEFT parieto-occipital periventricular white matter.  IMPRESSION: Motion degraded and prematurely truncated exam. No visible acute stroke. Severe atrophy.   Electronically Signed   By: Rolla Flatten M.D.   On: 11/10/2014 15:11    Scheduled Meds: . aspirin  300 mg Rectal Daily  . ciprofloxacin  500 mg Oral BID  . feeding supplement (ENSURE ENLIVE)  237 mL Oral BID BM  .  heparin  5,000 Units Subcutaneous 3 times per day  . multivitamin with minerals  1 tablet Oral Daily  . sodium chloride  3 mL Intravenous Q12H   Continuous Infusions:   Active Problems:   Dementia   Altered mental status   Transaminitis   Essential hypertension   Malnutrition of moderate degree    Time spent: 30 minutes    Barton Dubois  [facility] Pager [phone]. If 7PM-7AM, please contact night-coverage at [email or web address], password [facility] 11/11/2014, 5:51 PM  LOS: 1 day

## 2014-11-11 NOTE — Progress Notes (Signed)
Initial Nutrition Assessment  DOCUMENTATION CODES:  Non-severe (moderate) malnutrition in context of chronic illness  INTERVENTION:  Ensure Enlive (each supplement provides 350kcal and 20 grams of protein), MVI   Recommend providing 500 mg of Vitamin C once daily to promote wound healing  Provided "Protein Content of Foods List", "Suggestions for Increasing Calories and Protein" and "Food Sources of Vitamins and Minerals" handouts from the Academy of Nutrition and Dietetics. Discussed/reviewed handouts with patient's son at bedside. RD contact information provided.  NUTRITION DIAGNOSIS:  Increased nutrient needs related to wound healing as evidenced by estimated needs.   GOAL:  Patient will meet greater than or equal to 90% of their needs   MONITOR:  PO intake, Supplement acceptance, Skin, Labs, Weight trends  REASON FOR ASSESSMENT:  Consult Wound healing  ASSESSMENT: 81 y.o. 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 Pt lives with 2 sons who provide total care for patient. After eating breakfast she began vomiting followed by approximately 5 minute period where she was awake but unresponsive. Workup in the ED shows elevated LFTs though exam fairly unremarkable.  RD consulted regarding wound healing for patient with stage III pressure ulcer on back; per RN family has questions regarding nutrition for wound healing. Son at bedside reports that patient has a good appetite, eats 3 meals daily of soft/creamy foods, and drinks Boost and [employer] once daily. Son reports that patient has been maintaining her weight around 88 lbs.  RD encouraged intake of protein-rich foods, Vitamin A rich foods, and Vitamin C rich; provided food lists of good sources of vitamins and minerals. Recommended Vitamin C supplement, 500 mg per day while wound heals, as well as daily multivitamin.   Labs: high AST and ALT, low calcium  Height:  Ht  Readings from Last 1 Encounters:  05/16/11 5\' 6"  (1.676 m)    Weight: none filed  Wt Readings from Last 1 Encounters:  05/22/11 130 lb 1.1 oz (59 kg)    Ideal Body Weight:  59 kg  Wt Readings from Last 10 Encounters:  05/22/11 130 lb 1.1 oz (59 kg)    BMI:  There is no weight on file to calculate BMI.  Estimated Nutritional Needs:  Kcal:  1200-1400  Protein:  60-80 grams  Fluid:  1.2-1.4 L/day  Skin:  Wound (see comment) (stage III pressure ulcer on lower back)  Diet Order:  DIET - DYS 1 Room service appropriate?: No; Fluid consistency:: Thin  EDUCATION NEEDS:  Education needs addressed   Intake/Output Summary (Last 24 hours) at 11/11/14 1545 Last data filed at 11/11/14 1319  Gross per 24 hour  Intake    200 ml  Output      0 ml  Net    200 ml    Last BM:  5/8  Pryor Ochoa RD, LDN Inpatient Clinical Dietitian Pager: [phone] After Hours Pager: [phone]

## 2014-11-11 NOTE — Progress Notes (Signed)
PT Cancellation Note  Patient Details Name: KAYTLEN LIGHTSEY MRN: [id number] DOB: 08-27-1924   Cancelled Treatment:    Reason Eval/Treat Not Completed: Patient at procedure or test/unavailable   Duncan Dull 11/11/2014, 12:02 PM Alben Deeds, [place] DPT  [phone]

## 2014-11-11 NOTE — Evaluation (Signed)
Physical Therapy Evaluation Patient Details Name: Stephanie Bruce MRN: [id number] DOB: May 04, 1925 Today's Date: 11/11/2014   History of Present Illness  81 yo female from home with x2 son (A) with care. presents with vomitting and AMS.Ultrasound reveals gallstones. PMH: dementia, CVA , HTN, COPD, back surg,    Clinical Impression  Patient demonstrates fixed deficits in mobility, ROM and activity at baseline requires total assist for past 6 months. Provided handout to son RC:VELF and discussed positioning for protection of skin. No further acute PT needs, will sign off.     Follow Up Recommendations No PT follow up;Supervision/Assistance - 24 hour (plan to DC home with son and total assist)    Equipment Recommendations       Recommendations for Other Services       Precautions / Restrictions Precautions Precautions: Fall Precaution Comments: pt with stage 3 wound on back,  Restrictions Weight Bearing Restrictions: No      Mobility  Bed Mobility Overal bed mobility: +2 for physical assistance;+ 2 for safety/equipment;Needs Assistance Bed Mobility: Supine to Sit;Sit to Supine     Supine to sit: Total assist;+2 for physical assistance;+2 for safety/equipment Sit to supine: Total assist;+2 for physical assistance;+2 for safety/equipment   General bed mobility comments: Patient unable to assist in any way  Transfers Overall transfer level: Needs assistance Equipment used: 2 person hand held assist Transfers: Sit to/from Stand Sit to Stand: Total assist;+2 physical assistance;+2 safety/equipment         General transfer comment: unable to clear completely  Ambulation/Gait                Stairs            Wheelchair Mobility    Modified Rankin (Stroke Patients Only)       Balance     Sitting balance-Leahy Scale: Zero       Standing balance-Leahy Scale: Zero                               Pertinent Vitals/Pain Pain Assessment:  Faces Pain Score: 0-No pain Faces Pain Scale: No hurt    Home Living Family/patient expects to be discharged to:: Private residence Living Arrangements: Children Available Help at Discharge: Family Type of Home: House Home Access: Ramped entrance     Home Layout: One level Home Equipment: Wheelchair - manual      Prior Function Level of Independence: Needs assistance   Gait / Transfers Assistance Needed: does not walk, total (A) transfer only to w/c or chair  ADL's / Homemaking Assistance Needed:  total (A) supine in bed        Hand Dominance        Extremity/Trunk Assessment   Upper Extremity Assessment: Defer to OT evaluation           Lower Extremity Assessment: RLE deficits/detail;LLE deficits/detail RLE Deficits / Details: increased tone bilateral LEs, decreased ROM, contractures noted hips, knees and ankles LLE Deficits / Details: contractures with external rotation of hips with noted bil knee pressure spot noted, increased tone bilateral LEs, decreased ROM, contractures noted hips, knees and ankles L >R  Cervical / Trunk Assessment: Kyphotic  Communication   Communication: No difficulties  Cognition Arousal/Alertness: Awake/alert Behavior During Therapy: Flat affect Overall Cognitive Status: History of cognitive impairments - at baseline                      General  Comments General comments (skin integrity, edema, etc.): increased tone bilateral LEs, decreased ROM, contractures noted hips, knees and ankles. Provided hand out regarding PROM activities to son, also discussed pillow and positioning of ALL bony prominences for protection of skin integrity    Exercises        Assessment/Plan    PT Assessment Patent does not need any further PT services  PT Diagnosis Generalized weakness   PT Problem List    PT Treatment Interventions     PT Goals (Current goals can be found in the Care Plan section) Acute Rehab PT Goals PT Goal Formulation:  All assessment and education complete, DC therapy    Frequency     Barriers to discharge        Co-evaluation               End of Session Equipment Utilized During Treatment: Gait belt Activity Tolerance: No increased pain;Patient limited by fatigue Patient left: in bed;with call bell/phone within reach;with bed alarm set;with family/visitor present Nurse Communication: Mobility status         Time: 6213-0865 PT Time Calculation (min) (ACUTE ONLY): 14 min   Charges:   PT Evaluation $Initial PT Evaluation Tier I: 1 Procedure     PT G CodesDuncan Dull 11/29/14, 4:48 PM Alben Deeds, [place] DPT  [phone]

## 2014-11-11 NOTE — Progress Notes (Signed)
In HIDA scan.  Will follow up later after this is complete  Hafiz Irion E 11:22 AM 11/11/2014

## 2014-11-11 NOTE — Progress Notes (Signed)
Subjective: Per son she is back to her baseline. EEG unable to be obtained due to problems with lead attachment. No further episodes over night.   Exam: Filed Vitals:   11/11/14 1001  BP: 121/92  Pulse: 95  Temp: 99.3 F (37.4 C)  Resp:     HEENT-  Normocephalic, no lesions, without obvious abnormality.  Normal external eye and conjunctiva.  Normal TM's bilaterally.  Normal external nose, mucus membranes and septum.  Normal pharynx. Cardiovascular- S1, S2 normal, pulses palpable throughout   Lungs- chest clear, no wheezing, rales, normal symmetric air entry Abdomen- normal findings: bowel sounds normal Extremities- no edema Lymph-no adenopathy palpable Musculoskeletal-no joint tenderness, deformity or swelling Skin-warm and dry, no hyperpigmentation, vitiligo, or suspicious lesions    Gen: In bed, NAD MS: alert in bed, does not follow commands or speak.  Per son at bedside this is her baseline.  CN: EOMI, blinks to threat bilaterally, face symmetric. TML at rest.  Motor: Tone increased in right arm and hand with spastic paresis of right elbow.  Mild ability to abdict right arm. Left arm held in flexion holding stuffed animal and will not let go.  Bilateral leg moving indipendantly spontaniously with increased tone in right leg.  Sensory: responds to pain in all 4 extremities.  DTR:2+ on the right UE and KJ with 1+ on the left UE and KJ  Pertinent Labs: AST 388 ALT 294 Lipase 18   MRI brain/MRA brain IMPRESSION: Motion degraded and prematurely truncated exam. No visible acute stroke. Severe atrophy.  Etta Quill PA-C [facility] [phone]  Impression: 81 YO female with likely vagal episode following emesis.  CT head and MRI head unrevealing.  EEG unable to be obtained due to difficulty placing leads. No further episodes while in hospital and surgery following Gallstones.  At this time, I am not certain if she had a seizure or not. Given that it is a single  episode without clear diagnosis, I would not favor starting AED therapy. If she were to have further episodes, then this may need to be reconsidered.   Recommendations: 1) No further recommendations a this time, please call with any further questions or concerns.    Roland Rack, MD [facility] [phone]  If 7pm- 7am, please page neurology on call as listed in [place]. 11/11/2014, 1:38 PM

## 2014-11-11 NOTE — Progress Notes (Signed)
Unable to restart  PIV. IV team consult without success. MFD paged r/t issue. Will continue to monitor.  Ave Filter, RN

## 2014-11-11 NOTE — Progress Notes (Signed)
Patient ID: Stephanie Bruce, female   DOB: 01/17/1925, 81 y.o.   MRN: [id number]    Subjective: Pt essentially nonverbal.  Son says she does not seem to be in any pain.  Objective: Vital signs in last 24 hours: Temp:  [97.3 F (36.3 C)-101.8 F (38.8 C)] 99.3 F (37.4 C) (05/10 1001) Pulse Rate:  [79-103] 95 (05/10 1001) Resp:  [18-20] 18 (05/10 0655) BP: (108-138)/(48-92) 121/92 mmHg (05/10 1001) SpO2:  [94 %-99 %] 96 % (05/10 1001) Last BM Date: 11/09/14  Intake/Output from previous day: 05/09 0701 - 05/10 0700 In: 200 [IV Piggyback:200] Out: -  Intake/Output this shift:    PE: Abd: soft, NT, ND, +BS  Lab Results:   Recent Labs  11/10/14 1014 11/11/14 0717  WBC 10.5 9.5  HGB 15.8* 13.0  HCT 46.3* 38.7  PLT 279 265   BMET  Recent Labs  11/10/14 1014 11/11/14 0551  NA 141 141  K 3.9 4.7  CL 103 108  CO2 27 21*  GLUCOSE 228* 85  BUN 9 9  CREATININE 0.84 0.74  CALCIUM 9.2 8.5*   PT/INR  Recent Labs  11/10/14 1014  LABPROT 13.4  INR 1.01   CMP     Component Value Date/Time   NA 141 11/11/2014 0551   K 4.7 11/11/2014 0551   CL 108 11/11/2014 0551   CO2 21* 11/11/2014 0551   GLUCOSE 85 11/11/2014 0551   BUN 9 11/11/2014 0551   CREATININE 0.74 11/11/2014 0551   CALCIUM 8.5* 11/11/2014 0551   PROT 5.8* 11/11/2014 0551   ALBUMIN 2.8* 11/11/2014 0551   AST 388* 11/11/2014 0551   ALT 294* 11/11/2014 0551   ALKPHOS 109 11/11/2014 0551   BILITOT 2.1* 11/11/2014 0551   GFRNONAA >60 11/11/2014 0551   GFRAA >60 11/11/2014 0551   Lipase     Component Value Date/Time   LIPASE 18* 11/11/2014 1000       Studies/Results: Dg Chest 2 View  11/10/2014   CLINICAL DATA:  Altered mental status, hx of stroke causing pt to become non verbal, hx hypertension, hx copd  EXAM: CHEST  2 VIEW  COMPARISON:  05/16/2011.  09/10/2013.  FINDINGS: Cardiac silhouette is mildly enlarged. No mediastinal or hilar masses or evidence of adenopathy.  Minimal pleural effusions.  Minor medial lung base subsegmental atelectasis. No lung consolidation or edema. Lungs are hyperexpanded. No pneumothorax.  Bony thorax is diffusely demineralized.  IMPRESSION: 1. No acute cardiopulmonary disease. 2. Minimal pleural effusions. Minor lung base subsegmental atelectasis.   Electronically Signed   By: Lajean Manes M.D.   On: 11/10/2014 15:52   Ct Head Wo Contrast  11/10/2014   CLINICAL DATA:  Acute onset altered mental status with eyes deviated toward the left and right-sided facial droop  EXAM: CT HEAD WITHOUT CONTRAST  TECHNIQUE: Contiguous axial images were obtained from the base of the skull through the vertex without intravenous contrast.  COMPARISON:  May 16, 2011  FINDINGS: Moderate diffuse atrophy is stable. There is a calcification arising from the dura of the right frontal lobe measuring 1.0 x 0.7 cm consistent with a small meningioma, stable and not felt to have clinical significance. There is no surrounding edema or mass effect from this small calcification. There is no other evidence suggesting mass. There is no hemorrhage, extra-axial fluid collection, or midline shift. There is decreased attenuation surrounding the lateral ventricles, consistent with either interstitial edema from the ventricular enlargement or small vessel disease. These changes are stable. There  is no new gray-white compartment lesion. No acute infarct apparent. The bony calvarium appears intact. The mastoid air cells are clear.  IMPRESSION: Atrophy with probable periventricular small vessel disease, stable. No intracranial hemorrhage or mass effect. Small calcified meningioma in the left frontal region not felt to have clinical significance. This is a stable finding. No evidence suggesting acute infarct.   Electronically Signed   By: Lowella Grip III M.D.   On: 11/10/2014 10:58   Mr Brain Wo Contrast  11/10/2014   CLINICAL DATA:  81 yo F with transient alteration of awareness following emesis.  Possibilities include seizure, vagal episode caused by emesis, less likely acute stroke. This occurred earlier today.  EXAM: MRI HEAD WITHOUT CONTRAST  TECHNIQUE: Multiplanar, multiecho pulse sequences of the brain and surrounding structures were obtained without intravenous contrast.  COMPARISON:  None.  FINDINGS: The patient was uncooperative for the exam. There is severe motion degradation. The exam was truncated prematurely by the patient who crawled out of the scanner. Only diffusion imaging and T1 weighted imaging are sufficiently free of motion to allow interpretation.  No areas of restricted diffusion are observed. No intracranial mass lesion is evident. There is severe atrophy with hydrocephalus ex vacuo. Assessment of intracranial vascular patency is not possible. Grossly unremarkable extracranial soft tissues. Suspected chronic hemorrhagic infarct LEFT parieto-occipital periventricular white matter.  IMPRESSION: Motion degraded and prematurely truncated exam. No visible acute stroke. Severe atrophy.   Electronically Signed   By: Rolla Flatten M.D.   On: 11/10/2014 15:11   US Abdomen Complete  11/10/2014   CLINICAL DATA:  Elevated liver function tests. Altered mental status.  EXAM: ULTRASOUND ABDOMEN COMPLETE  COMPARISON:  CT, 10/29/2006.  Ultrasound, 08/07/2010.  FINDINGS: Gallbladder: Multiple small partly shadowing gallstones, mobile. There is gallbladder wall thickening with edema along the hepatic side of the gallbladder wall. Sonographic Murphy's sign is unreliable in this patient. Wall measures 4.9 mm in thickness.  Common bile duct: Diameter: 4.4 mm.  No duct stone is seen.  Liver: Liver is echogenic. No discrete liver mass or lesion is seen. Hepatopetal flow documented in the portal vein.  IVC: No abnormality visualized.  Pancreas: Obscured by bowel gas.  Spleen: Size and appearance within normal limits.  Right Kidney: Length: 9.8 cm. Echogenicity within normal limits. No mass or hydronephrosis  visualized.  Left Kidney: Length: 9.6 cm. Echogenicity within normal limits. No mass or hydronephrosis visualized.  Abdominal aorta: Small focal bulge in the infrarenal aorta measuring a maximum of 2.3 cm in greatest anterior-posterior dimension.  Other findings: Small bilateral pleural effusions.  IMPRESSION: 1. Distended gallbladder with wall thickening and multiple small stones. Consider acute cholecystitis in the proper clinical setting. 2. Echogenic liver consistent with hepatic steatosis. 3. No bile duct dilation. 4. Pancreas not visualized. Small pleural effusions. Mild ectasia of the infrarenal abdominal aorta.   Electronically Signed   By: Lajean Manes M.D.   On: 11/10/2014 16:04   Mr Jodene Nam Head/brain Wo Cm  11/10/2014   CLINICAL DATA:  81 yo F with transient alteration of awareness following emesis. Possibilities include seizure, vagal episode caused by emesis, less likely acute stroke. This occurred earlier today.  EXAM: MRI HEAD WITHOUT CONTRAST  TECHNIQUE: Multiplanar, multiecho pulse sequences of the brain and surrounding structures were obtained without intravenous contrast.  COMPARISON:  None.  FINDINGS: The patient was uncooperative for the exam. There is severe motion degradation. The exam was truncated prematurely by the patient who crawled out of the scanner. Only  diffusion imaging and T1 weighted imaging are sufficiently free of motion to allow interpretation.  No areas of restricted diffusion are observed. No intracranial mass lesion is evident. There is severe atrophy with hydrocephalus ex vacuo. Assessment of intracranial vascular patency is not possible. Grossly unremarkable extracranial soft tissues. Suspected chronic hemorrhagic infarct LEFT parieto-occipital periventricular white matter.  IMPRESSION: Motion degraded and prematurely truncated exam. No visible acute stroke. Severe atrophy.   Electronically Signed   By: Rolla Flatten M.D.   On: 11/10/2014 15:11     Anti-infectives: Anti-infectives    Start     Dose/Rate Route Frequency Ordered Stop   11/10/14 1800  ciprofloxacin (CIPRO) IVPB 400 mg     400 mg 200 mL/hr over 60 Minutes Intravenous Every 12 hours 11/10/14 1724         Assessment/Plan   Dementia "Altered Mental Status" Elevated LFTs Cholelithiasis Mild PCMN H/o CVA Sacral decub.  -HIDA scan is pending. -if this is negative or shows chronic cholecystitis, given no further pain, we would likely recommend a 14 day course of abx therapy and no surgical intervention.  If the HIDA is positive, the patient is not a good surgical candidate given her advanced dementia and her history or "dying" during surgery 3 times.  She may require a perc chole drain, or still if symptoms improves, abx therapy.  We will follow along.  LOS: 1 day    Aijah Lattner E 11/11/2014, 2:14 PM Pager: [phone]
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
## 2014-11-12 DIAGNOSIS — F0391 Unspecified dementia with behavioral disturbance: Secondary | ICD-10-CM

## 2014-11-12 LAB — HEMOGLOBIN A1C
Hgb A1c MFr Bld: 5.7 % — ABNORMAL HIGH (ref 4.8–5.6)
Mean Plasma Glucose: 117 mg/dL

## 2014-11-12 MED ORDER — ENSURE ENLIVE PO LIQD: 237.0000 mL | Freq: Two times a day (BID) | ORAL | Status: DC

## 2014-11-12 MED ORDER — CIPROFLOXACIN HCL 500 MG PO TABS: 500.0000 mg | ORAL_TABLET | Freq: Two times a day (BID) | ORAL | Status: DC

## 2014-11-12 NOTE — Progress Notes (Signed)
Patient discharged home with sons. RN discussed discharge instructions, medications, and follow up appointment with son. Patient's son stated he understood instructions. Prescription and discharge instructions given to patient's son. Patient was escorted to car via wheelchair with nurse tech. Patient's son has car which is accessible to patient in wheelchair. Neuro assessment unchanged. Patient in no apparent distress.

## 2014-11-12 NOTE — Progress Notes (Signed)
Speech Language Pathology Treatment: Dysphagia  Patient Details Name: Stephanie Bruce MRN: [id number] DOB: 09/17/1924 Today's Date: 11/12/2014 Time: 4290-3795 SLP Time Calculation (min) (ACUTE ONLY): 13 min  Assessment / Plan / Recommendation Clinical Impression  Pt does not show overt signs of aspiration with Dys 1 textures and thin liquids, which is her baseline diet. Primarily oral dysphagia includes prolonged bolus formation with solids, which son at bedside says is baseline as well - he will check her mouth for pocketed foods regularly. No further acute SLP needs identified, will sign off.   HPI Other Pertinent Information: 81 year old female admitted 11/10/14 due to AMS and vomiting after breakfast. PMH significant for CVA, COPD, severe dementia. CXR with no acute findings. Per son, pt has been tolerating puree diet with thin liquids at home without overt s/s aspiration.   Pertinent Vitals Pain Assessment: Faces Faces Pain Scale: No hurt  SLP Plan  All goals met    Recommendations Diet recommendations: Dysphagia 1 (puree);Thin liquid Liquids provided via: Straw Medication Administration: Crushed with puree Supervision: Staff to assist with self feeding;Full supervision/cueing for compensatory strategies Compensations: Slow rate;Small sips/bites;Check for pocketing Postural Changes and/or Swallow Maneuvers: Seated upright 90 degrees       Oral Care Recommendations: Oral care BID Follow up Recommendations: None Plan: All goals met    Stephanie Bruce, M.A. CCC-SLP [phone]  Stephanie Bruce 11/12/2014, 11:49 AM

## 2014-11-12 NOTE — Discharge Instructions (Signed)

## 2014-11-12 NOTE — Progress Notes (Signed)
Patient ID: Stephanie Bruce, female   DOB: 06-09-1925, 81 y.o.   MRN: [id number]   LOS: 2 days   Subjective: Demented, nonverbal. Family member at bedside say she's eating well.   Objective: Vital signs in last 24 hours: Temp:  [98.1 F (36.7 C)-99.1 F (37.3 C)] 99.1 F (37.3 C) (05/11 1035) Pulse Rate:  [78-90] 84 (05/11 1035) Resp:  [16-18] 17 (05/11 1035) BP: (135-149)/(41-72) 135/44 mmHg (05/11 1035) SpO2:  [90 %-98 %] 98 % (05/11 1035) Last BM Date: 11/09/14   Radiology Results NUCLEAR MEDICINE HEPATOBILIARY IMAGING  TECHNIQUE: Sequential images of the abdomen were obtained out to 60 minutes following intravenous administration of radiopharmaceutical.  RADIOPHARMACEUTICALS: 5.3 mCi Tc-5m Choletec IV  COMPARISON: 11/10/2014  FINDINGS: There is opacification of the liver immediately following injection of the radiotracer. Visualization of the biliary tree is noted at 15 minutes with small bowel activity at 20 minutes. Progressive drainage into the small bowel is noted. Imaging was continued to 90 minutes without visualization of the gallbladder. Patient was then administered 2.36 mg of morphine and after 25 minutes the gallbladder showed opacification.  IMPRESSION: Normal uptake and excretion of biliary tracer.  Changes consistent with a patent cystic duct.   Electronically Signed  By: Inez Catalina M.D.  On: 11/11/2014 14:26   Physical Exam General appearance: no distress Resp: clear to auscultation bilaterally Cardio: regular rate and rhythm GI: Soft, +BS   Assessment/Plan: Acute on chronic cholecystitis -- Continue treatment with abx, no indication for surgery or cholecytostomy tube at present. Disposition per primary team.    Lisette Abu, PA-C Pager: [phone] 11/12/2014

## 2014-11-12 NOTE — Discharge Summary (Signed)
Physician Discharge Summary  DARSHA ZUMSTEIN POE:[id number] DOB: 02-14-1925 DOA: 11/10/2014  PCP: No primary care provider on file.  Admit date: 11/10/2014 Discharge date: 11/12/2014  Time spent: 35 minutes  Recommendations for Outpatient Follow-up:  1. Needs to follow up with PCP for further management of chronic colecystitis after patient finished with antibiotics.  2. Needs repeat LFT  Discharge Diagnoses:    Chronic cholecystitis   Dementia   Altered mental status   Transaminitis   Essential hypertension   Malnutrition of moderate degree   Elevated LFTs   Vomiting alone    Discharge Condition: Stable.   Diet recommendation: Heart Healthy  There were no vitals filed for this visit.  History of present illness:  Talibah Colasurdo is a 81 y.o. 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 Pt lives with 2 sons who provide total care for patient. She is nonambulatory and requires total assist for all ADLs. She will answer 'yes' when asked questions, but otherwise nonverbal. Son states pt was in normal state of health upon waking up this am. After eating breakfast she began vomiting followed by approximately 5 minute period where she was awake but unresponsive. Son also noted pt to have left deviation and facial droop that has now resolved. Sons deny any recent fever or change in usual routine.  Workup in the ED shows elevated LFTs though exam fairly unremarkable. Son states pt is at baseline at time of admission. She is being admitted for further evaluation of altered mental status as well as elevated LFTs.  Hospital Course:  1-AMS, transient: in patient with moderate to severe dementia  -possible vasovagal from nausea/vomiting event vs TIA/stroke vs seizure vs infection -no further event appreciated since admission -MRI limited, but negative for acute stroke -patient with most likely acute on chronic cholecystitis  -plan is to treat with PO  antibiotics -follow clinical response -per family members now is back to baseline -no AED recommended by neurology  2-acute on chronic cholecystitis: transiet episode of fever and N/V -now resolved abd [place] suggesting acute cholecystitis and demonstrating cholelithiasis  -normal WBC's, LFT's trending down, normal HID scan, no further fever and tolerating PO dysphagia 1 diet -will follow surgery rec's and will treat with cipro for 14 days -no anticipation for surgery or needs for cholecystostomy at this point -high risk candidate for surgical interventions  3-Transaminitis: most likely after passing a gallstone -resolving/trending down -continue supportive care -will follow LFT trend intermittently  4-dementia: back to baseline now according to son at bedside  5-moderate protein calorie malnutrition: will use ensure BID and daily MV -dietitian consulted  6-DVT: heparin   Procedures:  hida  [place]  Consultations:  surgery  Discharge Exam: Filed Vitals:   11/12/14 1035  BP: 135/44  Pulse: 84  Temp: 99.1 F (37.3 C)  Resp: 17    General: Alert in no distress.  Cardiovascular: S 1, S 2 RRR Respiratory: CTA  Discharge Instructions   Discharge Instructions    Diet - low sodium heart healthy    Complete by:  As directed      Increase activity slowly    Complete by:  As directed           Current Discharge Medication List    START taking these medications   Details  ciprofloxacin (CIPRO) 500 MG tablet Take 1 tablet (500 mg total) by mouth 2 (two) times daily. Qty: 28 tablet, Refills: 0    feeding supplement,  ENSURE ENLIVE, (ENSURE ENLIVE) LIQD Take 237 mLs by mouth 2 (two) times daily between meals. Qty: 237 mL, Refills: 12      CONTINUE these medications which have NOT CHANGED   Details  aspirin 81 MG tablet Take 81 mg by mouth daily.    Multiple Vitamins-Minerals (MULTIVITAMIN WITH MINERALS) tablet Take 0.5 tablets by mouth daily.       Allergies   Allergen Reactions  . Zolpidem Tartrate     Pt becomes comatose.  . Banana   . Iron Other (See Comments)    Pt turn black   Follow-up Information    Please follow up.   Why:  needs to follow up with PCP in 2 weeks.        The results of significant diagnostics from this hospitalization (including imaging, microbiology, ancillary and laboratory) are listed below for reference.    Significant Diagnostic Studies: Dg Chest 2 View  11/10/2014   CLINICAL DATA:  Altered mental status, hx of stroke causing pt to become non verbal, hx hypertension, hx copd  EXAM: CHEST  2 VIEW  COMPARISON:  05/16/2011.  09/10/2013.  FINDINGS: Cardiac silhouette is mildly enlarged. No mediastinal or hilar masses or evidence of adenopathy.  Minimal pleural effusions. Minor medial lung base subsegmental atelectasis. No lung consolidation or edema. Lungs are hyperexpanded. No pneumothorax.  Bony thorax is diffusely demineralized.  IMPRESSION: 1. No acute cardiopulmonary disease. 2. Minimal pleural effusions. Minor lung base subsegmental atelectasis.   Electronically Signed   By: Lajean Manes M.D.   On: 11/10/2014 15:52   Ct Head Wo Contrast  11/10/2014   CLINICAL DATA:  Acute onset altered mental status with eyes deviated toward the left and right-sided facial droop  EXAM: CT HEAD WITHOUT CONTRAST  TECHNIQUE: Contiguous axial images were obtained from the base of the skull through the vertex without intravenous contrast.  COMPARISON:  May 16, 2011  FINDINGS: Moderate diffuse atrophy is stable. There is a calcification arising from the dura of the right frontal lobe measuring 1.0 x 0.7 cm consistent with a small meningioma, stable and not felt to have clinical significance. There is no surrounding edema or mass effect from this small calcification. There is no other evidence suggesting mass. There is no hemorrhage, extra-axial fluid collection, or midline shift. There is decreased attenuation surrounding the lateral  ventricles, consistent with either interstitial edema from the ventricular enlargement or small vessel disease. These changes are stable. There is no new gray-white compartment lesion. No acute infarct apparent. The bony calvarium appears intact. The mastoid air cells are clear.  IMPRESSION: Atrophy with probable periventricular small vessel disease, stable. No intracranial hemorrhage or mass effect. Small calcified meningioma in the left frontal region not felt to have clinical significance. This is a stable finding. No evidence suggesting acute infarct.   Electronically Signed   By: Lowella Grip III M.D.   On: 11/10/2014 10:58   Mr Brain Wo Contrast  11/10/2014   CLINICAL DATA:  81 yo F with transient alteration of awareness following emesis. Possibilities include seizure, vagal episode caused by emesis, less likely acute stroke. This occurred earlier today.  EXAM: MRI HEAD WITHOUT CONTRAST  TECHNIQUE: Multiplanar, multiecho pulse sequences of the brain and surrounding structures were obtained without intravenous contrast.  COMPARISON:  None.  FINDINGS: The patient was uncooperative for the exam. There is severe motion degradation. The exam was truncated prematurely by the patient who crawled out of the scanner. Only diffusion imaging and T1 weighted imaging  are sufficiently free of motion to allow interpretation.  No areas of restricted diffusion are observed. No intracranial mass lesion is evident. There is severe atrophy with hydrocephalus ex vacuo. Assessment of intracranial vascular patency is not possible. Grossly unremarkable extracranial soft tissues. Suspected chronic hemorrhagic infarct LEFT parieto-occipital periventricular white matter.  IMPRESSION: Motion degraded and prematurely truncated exam. No visible acute stroke. Severe atrophy.   Electronically Signed   By: Rolla Flatten M.D.   On: 11/10/2014 15:11   Nm Hepatobiliary Including Gb  11/11/2014   CLINICAL DATA:  Elevated LFTs  EXAM:  NUCLEAR MEDICINE HEPATOBILIARY IMAGING  TECHNIQUE: Sequential images of the abdomen were obtained out to 60 minutes following intravenous administration of radiopharmaceutical.  RADIOPHARMACEUTICALS:  5.3 mCi Tc-68m Choletec IV  COMPARISON:  11/10/2014  FINDINGS: There is opacification of the liver immediately following injection of the radiotracer. Visualization of the biliary tree is noted at 15 minutes with small bowel activity at 20 minutes. Progressive drainage into the small bowel is noted. Imaging was continued to 90 minutes without visualization of the gallbladder. Patient was then administered 2.36 mg of morphine and after 25 minutes the gallbladder showed opacification.  IMPRESSION: Normal uptake and excretion of biliary tracer.  Changes consistent with a patent cystic duct.   Electronically Signed   By: Inez Catalina M.D.   On: 11/11/2014 14:26   US Abdomen Complete  11/10/2014   CLINICAL DATA:  Elevated liver function tests. Altered mental status.  EXAM: ULTRASOUND ABDOMEN COMPLETE  COMPARISON:  CT, 10/29/2006.  Ultrasound, 08/07/2010.  FINDINGS: Gallbladder: Multiple small partly shadowing gallstones, mobile. There is gallbladder wall thickening with edema along the hepatic side of the gallbladder wall. Sonographic Murphy's sign is unreliable in this patient. Wall measures 4.9 mm in thickness.  Common bile duct: Diameter: 4.4 mm.  No duct stone is seen.  Liver: Liver is echogenic. No discrete liver mass or lesion is seen. Hepatopetal flow documented in the portal vein.  IVC: No abnormality visualized.  Pancreas: Obscured by bowel gas.  Spleen: Size and appearance within normal limits.  Right Kidney: Length: 9.8 cm. Echogenicity within normal limits. No mass or hydronephrosis visualized.  Left Kidney: Length: 9.6 cm. Echogenicity within normal limits. No mass or hydronephrosis visualized.  Abdominal aorta: Small focal bulge in the infrarenal aorta measuring a maximum of 2.3 cm in greatest  anterior-posterior dimension.  Other findings: Small bilateral pleural effusions.  IMPRESSION: 1. Distended gallbladder with wall thickening and multiple small stones. Consider acute cholecystitis in the proper clinical setting. 2. Echogenic liver consistent with hepatic steatosis. 3. No bile duct dilation. 4. Pancreas not visualized. Small pleural effusions. Mild ectasia of the infrarenal abdominal aorta.   Electronically Signed   By: Lajean Manes M.D.   On: 11/10/2014 16:04   Mr Jodene Nam Head/brain Wo Cm  11/10/2014   CLINICAL DATA:  81 yo F with transient alteration of awareness following emesis. Possibilities include seizure, vagal episode caused by emesis, less likely acute stroke. This occurred earlier today.  EXAM: MRI HEAD WITHOUT CONTRAST  TECHNIQUE: Multiplanar, multiecho pulse sequences of the brain and surrounding structures were obtained without intravenous contrast.  COMPARISON:  None.  FINDINGS: The patient was uncooperative for the exam. There is severe motion degradation. The exam was truncated prematurely by the patient who crawled out of the scanner. Only diffusion imaging and T1 weighted imaging are sufficiently free of motion to allow interpretation.  No areas of restricted diffusion are observed. No intracranial mass lesion is evident. There  is severe atrophy with hydrocephalus ex vacuo. Assessment of intracranial vascular patency is not possible. Grossly unremarkable extracranial soft tissues. Suspected chronic hemorrhagic infarct LEFT parieto-occipital periventricular white matter.  IMPRESSION: Motion degraded and prematurely truncated exam. No visible acute stroke. Severe atrophy.   Electronically Signed   By: Rolla Flatten M.D.   On: 11/10/2014 15:11    Microbiology: No results found for this or any previous visit (from the past 240 hour(s)).   Labs: Basic Metabolic Panel:  Recent Labs Lab 11/10/14 0959 11/10/14 1014 11/11/14 0551  NA 143 141 141  K 4.4 3.9 4.7  CL 100* 103 108   CO2  --  27 21*  GLUCOSE 242* 228* 85  BUN 13 9 9   CREATININE 0.70 0.84 0.74  CALCIUM  --  9.2 8.5*   Liver Function Tests:  Recent Labs Lab 11/10/14 1014 11/10/14 1907 11/11/14 0551  AST 1292* 668* 388*  ALT 476* 378* 294*  ALKPHOS 112 100 109  BILITOT 1.6* 1.8* 2.1*  PROT 7.1 6.1* 5.8*  ALBUMIN 3.4* 2.8* 2.8*    Recent Labs Lab 11/11/14 1000  LIPASE 18*    Recent Labs Lab 11/10/14 1907  AMMONIA 37*   CBC:  Recent Labs Lab 11/10/14 0959 11/10/14 1014 11/11/14 0717  WBC  --  10.5 9.5  NEUTROABS  --  8.7*  --   HGB 17.3* 15.8* 13.0  HCT 51.0* 46.3* 38.7  MCV  --  92.4 92.4  PLT  --  279 265   Cardiac Enzymes: No results for input(s): CKTOTAL, CKMB, CKMBINDEX, TROPONINI in the last 168 hours. BNP: BNP (last 3 results) No results for input(s): BNP in the last 8760 hours.  ProBNP (last 3 results) No results for input(s): PROBNP in the last 8760 hours.  CBG:  Recent Labs Lab 11/10/14 0946  GLUCAP 219*       Signed:  Maeli Spacek A  [facility] 11/12/2014, 11:52 AM

## 2015-09-03 ENCOUNTER — Encounter (HOSPITAL_BASED_OUTPATIENT_CLINIC_OR_DEPARTMENT_OTHER): Payer: Medicare Other | Attending: Internal Medicine

## 2015-09-03 DIAGNOSIS — G4730 Sleep apnea, unspecified: Secondary | ICD-10-CM | POA: Insufficient documentation

## 2015-09-03 DIAGNOSIS — E440 Moderate protein-calorie malnutrition: Secondary | ICD-10-CM | POA: Diagnosis not present

## 2015-09-03 DIAGNOSIS — I10 Essential (primary) hypertension: Secondary | ICD-10-CM | POA: Insufficient documentation

## 2015-09-03 DIAGNOSIS — L89622 Pressure ulcer of left heel, stage 2: Secondary | ICD-10-CM | POA: Diagnosis not present

## 2015-09-03 DIAGNOSIS — L89312 Pressure ulcer of right buttock, stage 2: Secondary | ICD-10-CM | POA: Insufficient documentation

## 2015-09-03 DIAGNOSIS — F0390 Unspecified dementia without behavioral disturbance: Secondary | ICD-10-CM | POA: Insufficient documentation

## 2015-09-03 DIAGNOSIS — L89103 Pressure ulcer of unspecified part of back, stage 3: Secondary | ICD-10-CM | POA: Diagnosis present

## 2015-09-03 DIAGNOSIS — G8220 Paraplegia, unspecified: Secondary | ICD-10-CM | POA: Insufficient documentation

## 2015-09-03 DIAGNOSIS — I69320 Aphasia following cerebral infarction: Secondary | ICD-10-CM | POA: Diagnosis not present

## 2015-09-03 DIAGNOSIS — I69354 Hemiplegia and hemiparesis following cerebral infarction affecting left non-dominant side: Secondary | ICD-10-CM | POA: Insufficient documentation

## 2015-09-03 DIAGNOSIS — L03312 Cellulitis of back [any part except buttock]: Secondary | ICD-10-CM | POA: Insufficient documentation

## 2015-09-10 DIAGNOSIS — L89103 Pressure ulcer of unspecified part of back, stage 3: Secondary | ICD-10-CM | POA: Diagnosis not present

## 2015-09-25 DIAGNOSIS — L89103 Pressure ulcer of unspecified part of back, stage 3: Secondary | ICD-10-CM | POA: Diagnosis not present

## 2015-09-25 MED FILL — SANTYL OINTMENT: 250 | 20 days supply | Qty: 30 | Fill #0

## 2015-10-09 ENCOUNTER — Encounter (HOSPITAL_BASED_OUTPATIENT_CLINIC_OR_DEPARTMENT_OTHER): Payer: Medicare Other | Attending: Internal Medicine

## 2015-10-09 DIAGNOSIS — L89622 Pressure ulcer of left heel, stage 2: Secondary | ICD-10-CM | POA: Diagnosis not present

## 2015-10-09 DIAGNOSIS — F0390 Unspecified dementia without behavioral disturbance: Secondary | ICD-10-CM | POA: Diagnosis not present

## 2015-10-09 DIAGNOSIS — E440 Moderate protein-calorie malnutrition: Secondary | ICD-10-CM | POA: Diagnosis not present

## 2015-10-09 DIAGNOSIS — I10 Essential (primary) hypertension: Secondary | ICD-10-CM | POA: Insufficient documentation

## 2015-10-09 DIAGNOSIS — L89103 Pressure ulcer of unspecified part of back, stage 3: Secondary | ICD-10-CM | POA: Diagnosis not present

## 2015-10-09 DIAGNOSIS — G4730 Sleep apnea, unspecified: Secondary | ICD-10-CM | POA: Diagnosis not present

## 2015-10-09 DIAGNOSIS — G8220 Paraplegia, unspecified: Secondary | ICD-10-CM | POA: Diagnosis not present

## 2015-10-23 DIAGNOSIS — L89103 Pressure ulcer of unspecified part of back, stage 3: Secondary | ICD-10-CM | POA: Diagnosis not present

## 2015-11-06 ENCOUNTER — Encounter (HOSPITAL_BASED_OUTPATIENT_CLINIC_OR_DEPARTMENT_OTHER): Payer: Medicare Other | Attending: Internal Medicine

## 2015-11-06 DIAGNOSIS — L03312 Cellulitis of back [any part except buttock]: Secondary | ICD-10-CM | POA: Insufficient documentation

## 2015-11-06 DIAGNOSIS — F0390 Unspecified dementia without behavioral disturbance: Secondary | ICD-10-CM | POA: Insufficient documentation

## 2015-11-06 DIAGNOSIS — G4730 Sleep apnea, unspecified: Secondary | ICD-10-CM | POA: Insufficient documentation

## 2015-11-06 DIAGNOSIS — R4701 Aphasia: Secondary | ICD-10-CM | POA: Insufficient documentation

## 2015-11-06 DIAGNOSIS — L89621 Pressure ulcer of left heel, stage 1: Secondary | ICD-10-CM | POA: Diagnosis not present

## 2015-11-06 DIAGNOSIS — G8194 Hemiplegia, unspecified affecting left nondominant side: Secondary | ICD-10-CM | POA: Insufficient documentation

## 2015-11-06 DIAGNOSIS — L89143 Pressure ulcer of left lower back, stage 3: Secondary | ICD-10-CM | POA: Diagnosis not present

## 2015-11-06 DIAGNOSIS — I639 Cerebral infarction, unspecified: Secondary | ICD-10-CM | POA: Insufficient documentation

## 2015-11-06 DIAGNOSIS — E440 Moderate protein-calorie malnutrition: Secondary | ICD-10-CM | POA: Diagnosis not present

## 2015-11-06 DIAGNOSIS — L89312 Pressure ulcer of right buttock, stage 2: Secondary | ICD-10-CM | POA: Diagnosis not present

## 2015-11-06 DIAGNOSIS — I10 Essential (primary) hypertension: Secondary | ICD-10-CM | POA: Insufficient documentation

## 2015-11-20 DIAGNOSIS — L89143 Pressure ulcer of left lower back, stage 3: Secondary | ICD-10-CM | POA: Diagnosis not present

## 2016-02-25 ENCOUNTER — Encounter (HOSPITAL_BASED_OUTPATIENT_CLINIC_OR_DEPARTMENT_OTHER): Payer: Medicare Other | Attending: Internal Medicine

## 2016-02-25 DIAGNOSIS — L89892 Pressure ulcer of other site, stage 2: Secondary | ICD-10-CM | POA: Diagnosis not present

## 2016-02-25 DIAGNOSIS — I69320 Aphasia following cerebral infarction: Secondary | ICD-10-CM | POA: Diagnosis not present

## 2016-02-25 DIAGNOSIS — G4730 Sleep apnea, unspecified: Secondary | ICD-10-CM | POA: Insufficient documentation

## 2016-02-25 DIAGNOSIS — I10 Essential (primary) hypertension: Secondary | ICD-10-CM | POA: Insufficient documentation

## 2016-02-25 DIAGNOSIS — I69354 Hemiplegia and hemiparesis following cerebral infarction affecting left non-dominant side: Secondary | ICD-10-CM | POA: Insufficient documentation

## 2016-02-25 DIAGNOSIS — G8220 Paraplegia, unspecified: Secondary | ICD-10-CM | POA: Diagnosis not present

## 2016-02-25 DIAGNOSIS — F0390 Unspecified dementia without behavioral disturbance: Secondary | ICD-10-CM | POA: Insufficient documentation

## 2016-02-25 DIAGNOSIS — J449 Chronic obstructive pulmonary disease, unspecified: Secondary | ICD-10-CM | POA: Diagnosis not present

## 2016-02-25 DIAGNOSIS — L89102 Pressure ulcer of unspecified part of back, stage 2: Secondary | ICD-10-CM | POA: Insufficient documentation

## 2016-03-10 ENCOUNTER — Encounter (HOSPITAL_BASED_OUTPATIENT_CLINIC_OR_DEPARTMENT_OTHER): Payer: Medicare Other | Attending: Internal Medicine

## 2016-03-10 DIAGNOSIS — F0390 Unspecified dementia without behavioral disturbance: Secondary | ICD-10-CM | POA: Insufficient documentation

## 2016-03-10 DIAGNOSIS — L89102 Pressure ulcer of unspecified part of back, stage 2: Secondary | ICD-10-CM | POA: Insufficient documentation

## 2016-07-12 ENCOUNTER — Encounter (HOSPITAL_BASED_OUTPATIENT_CLINIC_OR_DEPARTMENT_OTHER): Payer: Medicare Other | Attending: Surgery

## 2016-07-12 DIAGNOSIS — I10 Essential (primary) hypertension: Secondary | ICD-10-CM | POA: Diagnosis not present

## 2016-07-12 DIAGNOSIS — B369 Superficial mycosis, unspecified: Secondary | ICD-10-CM | POA: Insufficient documentation

## 2016-07-12 DIAGNOSIS — J449 Chronic obstructive pulmonary disease, unspecified: Secondary | ICD-10-CM | POA: Diagnosis not present

## 2016-07-12 DIAGNOSIS — F0280 Dementia in other diseases classified elsewhere without behavioral disturbance: Secondary | ICD-10-CM | POA: Diagnosis not present

## 2016-07-12 DIAGNOSIS — G8220 Paraplegia, unspecified: Secondary | ICD-10-CM | POA: Diagnosis not present

## 2016-07-12 DIAGNOSIS — G309 Alzheimer's disease, unspecified: Secondary | ICD-10-CM | POA: Diagnosis not present

## 2016-07-12 DIAGNOSIS — Z7982 Long term (current) use of aspirin: Secondary | ICD-10-CM | POA: Insufficient documentation

## 2016-07-12 DIAGNOSIS — Z79899 Other long term (current) drug therapy: Secondary | ICD-10-CM | POA: Insufficient documentation

## 2016-07-12 DIAGNOSIS — G4730 Sleep apnea, unspecified: Secondary | ICD-10-CM | POA: Insufficient documentation

## 2016-07-12 DIAGNOSIS — L89102 Pressure ulcer of unspecified part of back, stage 2: Secondary | ICD-10-CM | POA: Diagnosis present

## 2016-07-19 DIAGNOSIS — L89102 Pressure ulcer of unspecified part of back, stage 2: Secondary | ICD-10-CM | POA: Diagnosis not present

## 2016-07-19 MED FILL — CLOTRIMAZOLE-BETAMETHASONE: 1-0.05 | 14 days supply | Qty: 15 | Fill #0

## 2016-08-09 ENCOUNTER — Encounter (HOSPITAL_BASED_OUTPATIENT_CLINIC_OR_DEPARTMENT_OTHER): Payer: Medicare Other | Attending: Surgery

## 2016-08-09 DIAGNOSIS — J449 Chronic obstructive pulmonary disease, unspecified: Secondary | ICD-10-CM | POA: Diagnosis not present

## 2016-08-09 DIAGNOSIS — F0390 Unspecified dementia without behavioral disturbance: Secondary | ICD-10-CM | POA: Insufficient documentation

## 2016-08-09 DIAGNOSIS — I10 Essential (primary) hypertension: Secondary | ICD-10-CM | POA: Insufficient documentation

## 2016-08-09 DIAGNOSIS — L89102 Pressure ulcer of unspecified part of back, stage 2: Secondary | ICD-10-CM | POA: Insufficient documentation

## 2016-08-09 DIAGNOSIS — G8220 Paraplegia, unspecified: Secondary | ICD-10-CM | POA: Diagnosis not present

## 2016-08-09 DIAGNOSIS — B369 Superficial mycosis, unspecified: Secondary | ICD-10-CM | POA: Diagnosis not present

## 2016-08-09 DIAGNOSIS — G4730 Sleep apnea, unspecified: Secondary | ICD-10-CM | POA: Insufficient documentation

## 2016-09-06 ENCOUNTER — Encounter (HOSPITAL_BASED_OUTPATIENT_CLINIC_OR_DEPARTMENT_OTHER): Payer: Medicare Other | Attending: Surgery

## 2016-09-06 DIAGNOSIS — G309 Alzheimer's disease, unspecified: Secondary | ICD-10-CM | POA: Insufficient documentation

## 2016-09-06 DIAGNOSIS — Z872 Personal history of diseases of the skin and subcutaneous tissue: Secondary | ICD-10-CM | POA: Insufficient documentation

## 2016-09-06 DIAGNOSIS — Z09 Encounter for follow-up examination after completed treatment for conditions other than malignant neoplasm: Secondary | ICD-10-CM | POA: Diagnosis present

## 2016-10-19 ENCOUNTER — Encounter (HOSPITAL_COMMUNITY): Payer: Self-pay | Admitting: *Deleted

## 2016-10-19 ENCOUNTER — Observation Stay (HOSPITAL_COMMUNITY)
Admission: EM | Admit: 2016-10-19 | Discharge: 2016-10-21 | Disposition: A | Discharge status: Home / Self Care | Payer: Medicare Other | Attending: Internal Medicine | Admitting: Internal Medicine

## 2016-10-19 DIAGNOSIS — Z85828 Personal history of other malignant neoplasm of skin: Secondary | ICD-10-CM | POA: Diagnosis not present

## 2016-10-19 DIAGNOSIS — F0390 Unspecified dementia without behavioral disturbance: Secondary | ICD-10-CM | POA: Insufficient documentation

## 2016-10-19 DIAGNOSIS — I10 Essential (primary) hypertension: Secondary | ICD-10-CM | POA: Insufficient documentation

## 2016-10-19 DIAGNOSIS — K921 Melena: Secondary | ICD-10-CM | POA: Diagnosis not present

## 2016-10-19 DIAGNOSIS — E876 Hypokalemia: Secondary | ICD-10-CM

## 2016-10-19 DIAGNOSIS — D62 Acute posthemorrhagic anemia: Secondary | ICD-10-CM | POA: Diagnosis not present

## 2016-10-19 DIAGNOSIS — Z8673 Personal history of transient ischemic attack (TIA), and cerebral infarction without residual deficits: Secondary | ICD-10-CM | POA: Insufficient documentation

## 2016-10-19 DIAGNOSIS — E872 Acidosis: Secondary | ICD-10-CM | POA: Insufficient documentation

## 2016-10-19 DIAGNOSIS — K5909 Other constipation: Secondary | ICD-10-CM | POA: Insufficient documentation

## 2016-10-19 DIAGNOSIS — Z91018 Allergy to other foods: Secondary | ICD-10-CM | POA: Diagnosis not present

## 2016-10-19 DIAGNOSIS — K625 Hemorrhage of anus and rectum: Secondary | ICD-10-CM

## 2016-10-19 DIAGNOSIS — Z888 Allergy status to other drugs, medicaments and biological substances status: Secondary | ICD-10-CM | POA: Insufficient documentation

## 2016-10-19 DIAGNOSIS — Z7982 Long term (current) use of aspirin: Secondary | ICD-10-CM | POA: Diagnosis not present

## 2016-10-19 HISTORY — DX: Hemorrhage of anus and rectum: K62.5

## 2016-10-19 HISTORY — DX: Personal history of urinary calculi: Z87.442

## 2016-10-19 LAB — I-STAT CHEM 8, ED
BUN: 4 mg/dL — ABNORMAL LOW (ref 6–20)
CALCIUM ION: 1.02 mmol/L — AB (ref 1.15–1.40)
Chloride: 96 mmol/L — ABNORMAL LOW (ref 101–111)
Creatinine, Ser: 0.5 mg/dL (ref 0.44–1.00)
Glucose, Bld: 101 mg/dL — ABNORMAL HIGH (ref 65–99)
HEMATOCRIT: 34 % — AB (ref 36.0–46.0)
Hemoglobin: 11.6 g/dL — ABNORMAL LOW (ref 12.0–15.0)
Potassium: 2.6 mmol/L — CL (ref 3.5–5.1)
SODIUM: 140 mmol/L (ref 135–145)
TCO2: 33 mmol/L (ref 0–100)

## 2016-10-19 LAB — COMPREHENSIVE METABOLIC PANEL
ALBUMIN: 2.4 g/dL — AB (ref 3.5–5.0)
ALT: 19 U/L (ref 14–54)
ANION GAP: 9 (ref 5–15)
AST: 45 U/L — ABNORMAL HIGH (ref 15–41)
Alkaline Phosphatase: 91 U/L (ref 38–126)
BILIRUBIN TOTAL: 0.9 mg/dL (ref 0.3–1.2)
BUN: 5 mg/dL — ABNORMAL LOW (ref 6–20)
CO2: 31 mmol/L (ref 22–32)
Calcium: 7.9 mg/dL — ABNORMAL LOW (ref 8.9–10.3)
Chloride: 99 mmol/L — ABNORMAL LOW (ref 101–111)
Creatinine, Ser: 0.53 mg/dL (ref 0.44–1.00)
GFR calc Af Amer: >60 mL/min (ref >60)
GFR calc non Af Amer: >60 mL/min (ref >60)
GLUCOSE: 96 mg/dL (ref 65–99)
POTASSIUM: 2.5 mmol/L — AB (ref 3.5–5.1)
Sodium: 139 mmol/L (ref 135–145)
Total Protein: 5.3 g/dL — ABNORMAL LOW (ref 6.5–8.1)

## 2016-10-19 LAB — CBC WITH DIFFERENTIAL/PLATELET
BASOS PCT: 0 %
Basophils Absolute: 0 10*3/uL (ref 0.0–0.1)
EOS ABS: 0.1 10*3/uL (ref 0.0–0.7)
Eosinophils Relative: 0 %
HCT: 35.4 % — ABNORMAL LOW (ref 36.0–46.0)
HEMOGLOBIN: 11.9 g/dL — AB (ref 12.0–15.0)
LYMPHS ABS: 3.1 10*3/uL (ref 0.7–4.0)
Lymphocytes Relative: 24 %
MCH: 29.9 pg (ref 26.0–34.0)
MCHC: 33.6 g/dL (ref 30.0–36.0)
MCV: 88.9 fL (ref 78.0–100.0)
MONO ABS: 1.4 10*3/uL — AB (ref 0.1–1.0)
MONOS PCT: 11 %
Neutro Abs: 8.4 10*3/uL — ABNORMAL HIGH (ref 1.7–7.7)
Neutrophils Relative %: 65 %
Platelets: 292 10*3/uL (ref 150–400)
RBC: 3.98 MIL/uL (ref 3.87–5.11)
RDW: 14.7 % (ref 11.5–15.5)
WBC: 13 10*3/uL — ABNORMAL HIGH (ref 4.0–10.5)

## 2016-10-19 LAB — LACTIC ACID, PLASMA
LACTIC ACID, VENOUS: 2.2 mmol/L — AB (ref 0.5–1.9)
Lactic Acid, Venous: 1.6 mmol/L (ref 0.5–1.9)

## 2016-10-19 LAB — TYPE AND SCREEN
ABO/RH(D): A POS
Antibody Screen: NEGATIVE

## 2016-10-19 LAB — MAGNESIUM: Magnesium: 1.6 mg/dL — ABNORMAL LOW (ref 1.7–2.4)

## 2016-10-19 LAB — POC OCCULT BLOOD, ED
FECAL OCCULT BLD: NEGATIVE
Fecal Occult Bld: POSITIVE — AB

## 2016-10-19 LAB — I-STAT CG4 LACTIC ACID, ED: Lactic Acid, Venous: 2.87 mmol/L (ref 0.5–1.9)

## 2016-10-19 LAB — HEMOGLOBIN AND HEMATOCRIT, BLOOD
HCT: 35.9 % — ABNORMAL LOW (ref 36.0–46.0)
HCT: 33.5 % — ABNORMAL LOW (ref 36.0–46.0)
HEMOGLOBIN: 12.2 g/dL (ref 12.0–15.0)
Hemoglobin: 11.7 g/dL — ABNORMAL LOW (ref 12.0–15.0)

## 2016-10-19 LAB — I-STAT TROPONIN, ED: TROPONIN I, POC: 0.01 ng/mL (ref 0.00–0.08)

## 2016-10-19 LAB — ABO/RH: ABO/RH(D): A POS

## 2016-10-19 MED ORDER — ACETAMINOPHEN 650 MG RE SUPP: 650.0000 mg | Freq: Four times a day (QID) | RECTAL | Status: DC | PRN

## 2016-10-19 MED ORDER — SODIUM CHLORIDE 0.9 % IV SOLN
INTRAVENOUS | Status: DC
  Administered 2016-10-19: 18:00:00 via INTRAVENOUS

## 2016-10-19 MED ORDER — SODIUM CHLORIDE 0.9 % IV SOLN
30.0000 meq | Freq: Once | INTRAVENOUS | Status: AC
  Administered 2016-10-19: 30 meq via INTRAVENOUS
  Filled 2016-10-19: qty 15

## 2016-10-19 MED ORDER — ONDANSETRON HCL 4 MG PO TABS: 4.0000 mg | ORAL_TABLET | Freq: Four times a day (QID) | ORAL | Status: DC | PRN

## 2016-10-19 MED ORDER — SODIUM CHLORIDE 0.9 % IV BOLUS (SEPSIS)
500.0000 mL | Freq: Once | INTRAVENOUS | Status: AC
  Administered 2016-10-19: 500 mL via INTRAVENOUS

## 2016-10-19 MED ORDER — SODIUM CHLORIDE 0.9 % IV BOLUS (SEPSIS)
1000.0000 mL | Freq: Once | INTRAVENOUS | Status: AC
  Administered 2016-10-19: 1000 mL via INTRAVENOUS

## 2016-10-19 MED ORDER — ACETAMINOPHEN 325 MG PO TABS: 650.0000 mg | ORAL_TABLET | Freq: Four times a day (QID) | ORAL | Status: DC | PRN

## 2016-10-19 MED ORDER — ONDANSETRON HCL 4 MG/2ML IJ SOLN: 4.0000 mg | Freq: Four times a day (QID) | INTRAMUSCULAR | Status: DC | PRN

## 2016-10-19 NOTE — ED Notes (Signed)
Abnormal CG-4 and I-stat Chem-8 reported to [facility]
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
## 2016-10-19 NOTE — ED Triage Notes (Signed)
Pt arrives from home via [facility]. Pt son states he noticed about 150 mL of bright red blood in the pts brief this morning. Upon getting pt on the toilet to have a BM son states she had a LOC. Upon arrival pt has frank red blood in her brief and a stage 2 pressure ulcer on her buttocks.

## 2016-10-19 NOTE — ED Notes (Signed)
Report attempted X1 
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
## 2016-10-19 NOTE — Progress Notes (Signed)
Paged MD Darrick Meigs about patient BP remaining high and there was no response, will pass onto night shift to recheck soon and if still high page on call. Patient not symptomatic.

## 2016-10-19 NOTE — Progress Notes (Signed)
Notified Forrest Moron NP for lactic acid of 2.2, new order received.

## 2016-10-19 NOTE — Progress Notes (Signed)
Patient arrived to 6n29, nonverbal which per son is her normal, IV intact, placed on telemetry, family at the bedside. Placed two new dressings to ulcers on the back and bottom. Will continue to monitor. BP high will recheck in 30 min.

## 2016-10-20 DIAGNOSIS — E876 Hypokalemia: Secondary | ICD-10-CM | POA: Diagnosis not present

## 2016-10-20 DIAGNOSIS — D62 Acute posthemorrhagic anemia: Secondary | ICD-10-CM | POA: Diagnosis not present

## 2016-10-20 DIAGNOSIS — K625 Hemorrhage of anus and rectum: Secondary | ICD-10-CM | POA: Diagnosis not present

## 2016-10-20 LAB — CBC
HEMATOCRIT: 33.6 % — AB (ref 36.0–46.0)
HEMOGLOBIN: 11.4 g/dL — AB (ref 12.0–15.0)
MCH: 30.5 pg (ref 26.0–34.0)
MCHC: 33.9 g/dL (ref 30.0–36.0)
MCV: 89.8 fL (ref 78.0–100.0)
Platelets: 269 10*3/uL (ref 150–400)
RBC: 3.74 MIL/uL — ABNORMAL LOW (ref 3.87–5.11)
RDW: 15.1 % (ref 11.5–15.5)
WBC: 8.6 10*3/uL (ref 4.0–10.5)

## 2016-10-20 LAB — BASIC METABOLIC PANEL
ANION GAP: 10 (ref 5–15)
BUN: 5 mg/dL — ABNORMAL LOW (ref 6–20)
CALCIUM: 7.8 mg/dL — AB (ref 8.9–10.3)
CO2: 27 mmol/L (ref 22–32)
CREATININE: 0.65 mg/dL (ref 0.44–1.00)
Chloride: 103 mmol/L (ref 101–111)
Glucose, Bld: 211 mg/dL — ABNORMAL HIGH (ref 65–99)
Potassium: 3.6 mmol/L (ref 3.5–5.1)
SODIUM: 140 mmol/L (ref 135–145)

## 2016-10-20 LAB — COMPREHENSIVE METABOLIC PANEL
ALBUMIN: 2.2 g/dL — AB (ref 3.5–5.0)
ALT: 18 U/L (ref 14–54)
ANION GAP: 10 (ref 5–15)
AST: 35 U/L (ref 15–41)
Alkaline Phosphatase: 87 U/L (ref 38–126)
BILIRUBIN TOTAL: 1.2 mg/dL (ref 0.3–1.2)
CALCIUM: 7.6 mg/dL — AB (ref 8.9–10.3)
CO2: 30 mmol/L (ref 22–32)
CREATININE: 0.5 mg/dL (ref 0.44–1.00)
Chloride: 99 mmol/L — ABNORMAL LOW (ref 101–111)
GFR calc Af Amer: >60 mL/min (ref >60)
GFR calc non Af Amer: >60 mL/min (ref >60)
GLUCOSE: 110 mg/dL — AB (ref 65–99)
Potassium: 2.2 mmol/L — CL (ref 3.5–5.1)
Sodium: 139 mmol/L (ref 135–145)
TOTAL PROTEIN: 5.4 g/dL — AB (ref 6.5–8.1)

## 2016-10-20 LAB — HEMOGLOBIN AND HEMATOCRIT, BLOOD
HCT: 33.1 % — ABNORMAL LOW (ref 36.0–46.0)
HEMATOCRIT: 32.6 % — AB (ref 36.0–46.0)
HEMOGLOBIN: 10.9 g/dL — AB (ref 12.0–15.0)
Hemoglobin: 11.3 g/dL — ABNORMAL LOW (ref 12.0–15.0)

## 2016-10-20 MED ORDER — MAGNESIUM SULFATE 2 GM/50ML IV SOLN
2.0000 g | Freq: Once | INTRAVENOUS | Status: AC
  Administered 2016-10-20: 2 g via INTRAVENOUS
  Filled 2016-10-20: qty 50

## 2016-10-20 MED ORDER — ENSURE ENLIVE PO LIQD
237.0000 mL | Freq: Two times a day (BID) | ORAL | Status: DC
  Administered 2016-10-20 – 2016-10-21 (×2): 237 mL via ORAL

## 2016-10-20 MED ORDER — ORAL CARE MOUTH RINSE
15.0000 mL | Freq: Two times a day (BID) | OROMUCOSAL | Status: DC
  Administered 2016-10-20 – 2016-10-21 (×2): 15 mL via OROMUCOSAL

## 2016-10-20 MED ORDER — ADULT MULTIVITAMIN W/MINERALS CH
1.0000 | ORAL_TABLET | Freq: Every day | ORAL | Status: DC
  Administered 2016-10-20 – 2016-10-21 (×2): 1 via ORAL
  Filled 2016-10-20 (×2): qty 1

## 2016-10-20 MED ORDER — POTASSIUM CHLORIDE IN NACL 40-0.9 MEQ/L-% IV SOLN
INTRAVENOUS | Status: DC
  Administered 2016-10-20 (×2): 75 mL/h via INTRAVENOUS
  Filled 2016-10-20 (×3): qty 1000

## 2016-10-20 MED ORDER — SODIUM CHLORIDE 0.9 % IV SOLN
30.0000 meq | INTRAVENOUS | Status: AC
  Administered 2016-10-20 (×2): 30 meq via INTRAVENOUS
  Filled 2016-10-20 (×2): qty 15

## 2016-10-20 NOTE — Progress Notes (Signed)
[facility] PROGRESS NOTE  Stephanie Bruce QPR:[id number] DOB: 07-27-1924 DOA: 10/19/2016  PCP: Orpah Melter, MD  Brief History/Interval Summary: 83 year old Caucasian female with a past medical history of advanced dementia, history of stroke, essential hypertension. Lives with her sons. Her sons provide around-the-clock care. Patient is somebody who needs full assistance. She was brought in due to rectal bleeding.  Reason for Visit: Rectal bleeding  Consultants: None  Procedures: None  Antibiotics: None  Subjective/Interval History: Patient has advanced dementia. Does not answer any questions.  ROS: Unable to do due to dementia  Objective:  Vital Signs  Vitals:   10/19/16 1756 10/19/16 1835 10/19/16 2117 10/20/16 0536  BP: (!) 193/87 (!) 181/45 (!) 171/89 (!) 153/84  Pulse: (!) 106 95 98 92  Resp: (!) 28 (!) 25 (!) 22 20  Temp: 98.7 F (37.1 C)  98.8 F (37.1 C) 97.8 F (36.6 C)  TempSrc: Oral  Oral Oral  SpO2: 93% 95% 95% 92%  Weight: 51.3 kg (113 lb 1.5 oz)       Intake/Output Summary (Last 24 hours) at 10/20/16 1302 Last data filed at 10/20/16 0534  Gross per 24 hour  Intake          1059.17 ml  Output                0 ml  Net          1059.17 ml   Filed Weights   10/19/16 1756  Weight: 51.3 kg (113 lb 1.5 oz)    General appearance: distracted and no distress Resp: clear to auscultation bilaterally Cardio: regular rate and rhythm, S1, S2 normal, no murmur, click, rub or gallop GI: soft, non-tender; bowel sounds normal; no masses,  no organomegaly Extremities: Contracted extremities Neurologic: Encephalopathic. Contracted extremities.  Lab Results:  Data Reviewed: I have personally reviewed following labs and imaging studies  CBC:  Recent Labs Lab 10/19/16 1512 10/19/16 1856 10/19/16 2232 10/20/16 0553 10/20/16 1222  WBC 13.0*  --   --  8.6  --   NEUTROABS 8.4*  --   --   --   --   HGB 11.9* 12.2 11.7* 11.4* 11.3*  HCT 35.4* 35.9*  33.5* 33.6* 33.1*  MCV 88.9  --   --  89.8  --   PLT 292  --   --  269  --     Basic Metabolic Panel:  Recent Labs Lab 10/19/16 1356 10/19/16 1405 10/19/16 1432 10/20/16 0553  NA 139 140  --  139  K 2.5* 2.6*  --  2.2*  CL 99* 96*  --  99*  CO2 31  --   --  30  GLUCOSE 96 101*  --  110*  BUN 5* 4*  --  <5*  CREATININE 0.53 0.50  --  0.50  CALCIUM 7.9*  --   --  7.6*  MG  --   --  1.6*  --     GFR: CrCl cannot be calculated (Unknown ideal weight.).  Liver Function Tests:  Recent Labs Lab 10/19/16 1356 10/20/16 0553  AST 45* 35  ALT 19 18  ALKPHOS 91 87  BILITOT 0.9 1.2  PROT 5.3* 5.4*  ALBUMIN 2.4* 2.2*     Medications:  Scheduled: . feeding supplement (ENSURE ENLIVE)  237 mL Oral BID BM  . mouth rinse  15 mL Mouth Rinse BID  . multivitamin with minerals  1 tablet Oral Daily   Continuous: . 0.9 % NaCl with KCl  40 mEq / L 75 mL/hr (10/20/16 0758)  . potassium chloride (KCL MULTIRUN) 30 mEq in 265 mL IVPB 30 mEq (10/20/16 0800)   ZSM:OLMBEMLJQGBEE **OR** acetaminophen, ondansetron **OR** ondansetron (ZOFRAN) IV  Assessment/Plan:  Active Problems:   Rectal bleed   Hypokalemia    Rectal bleeding Likely diverticular, review of old imaging records from 2008, shows moderate to severe sigmoid diverticulosis on barium enema. Appears to have abated. No reports of any further bleeding. Patient's son reports that she had a very hard stools 2 days ago. They had run out of the [place]. This probably triggered the bleeding. Advance diet. Hemoglobin has been stable. Patient not a candidate for any invasive testing. Continue to hold aspirin.  Acute blood loss Anemia Hemoglobin has been stable. Continue to monitor.   Severe Hypokalemia and hypomagnesemia Possibly due to bleeding but also likely due to malnutrition. Aggressively replete. We'll recheck potassium level later today.  Chronic constipation Patient will benefit from daily or twice a day MiraLAX at the  time of discharge.  Lactic acidosis Patient's lactic acid was found to be 2.87, likely from dehydration. Blood pressure was stable. She was given IV fluids. Lactic acid has corrected. No evidence for any infection. She is afebrile. Continue to monitor.   DVT Prophylaxis: SCDs    Code Status: Full code. This was verified with son  Family Communication: Discussed with son in detail  Disposition Plan: Continue management as outlined above. Anticipate discharge tomorrow.    LOS: 0 days   [facility] Pager [phone] 10/20/2016, 1:02 PM  If 7PM-7AM, please contact night-coverage at [email or web address], password [facility]

## 2016-10-20 NOTE — Progress Notes (Signed)
Paged Dr.Krishnan  For K+ of 2.2. Awaiting for reply.

## 2016-10-20 NOTE — Care Management Obs Status (Signed)
[facility] NOTIFICATION   Patient Details  Name: Stephanie Bruce MRN: [id number] Date of Birth: 1924/10/18   Medicare Observation Status Notification Given:  Yes  Given and explained to patient's son Chantella Creech  at bedside. Mr Cly voiced understanding.  Marilu Favre, RN 10/20/2016, 1:27 PM

## 2016-10-20 NOTE — Progress Notes (Signed)
Initial Nutrition Assessment  DOCUMENTATION CODES:   Underweight  INTERVENTION:   -Ensure Enlive po BID, each supplement provides 350 kcal and 20 grams of protein -MVI daily  NUTRITION DIAGNOSIS:   Increased nutrient needs related to wound healing as evidenced by estimated needs.  GOAL:   Patient will meet greater than or equal to 90% of their needs  MONITOR:   PO intake, Supplement acceptance, Labs, Weight trends, Skin, I & O's  REASON FOR ASSESSMENT:   Low Braden    ASSESSMENT:    Stephanie Bruce  is a 83 y.o. female, With history of dementia, stroke, hypertension who was brought to the ED by her son for chief complaint of rectal bleeding which started last night. Patient's son who provides most of the history as patient is nonverbal says that patient started having bloody bowel movements and blood was bright red mixed with old clotted blood. This morning when he sat patient on toilet bowl, it was filled with bright red blood.  Pt admitted with rectal bleeding.   Spoke with pt son at bedside, who cares for her at home. Pt typically consumes 3 meals per day and generally has a good appetite. Pt son assists with feeding her and has been on a pureed diet for many years. Pt consumes either a [employer] Breakfast shake or Boost supplement daily.   Pt son denies any weight loss. He reports pt has always been small framed and at one point weighed 80#. No recent wt hx available to assess.   Pt with stage III pressure injury to lt buttocks and stage II pressure injury to lt lower back. Per pt son wounds are chronic and pt is followed by wound center.   Nutrition-Focused physical exam completed. Findings are mild to moderate fat depletion, mild to mdoerate muscle depletion, and no edema. Fat and muscle depletions most apparent in upper and lower extremities, which is common for elderly pts with limited mobility. Per pt son, pt spends most of the day in bed or chair, with transfer to  bathroom.  Discussed importance of good meal and supplement intake to promote healing. Pt son is amenable to Ensure supplements.  Labs reviewed: K: 2.2 (on IV supplementation), Mg: 1.6.   Diet Order:  DIET - DYS 1 Room service appropriate? Yes; Fluid consistency: Thin  Skin:  Wound (see comment) (st II lt lower back, st III lt buttocks)  Last BM:  10/19/16  Height:   Ht Readings from Last 1 Encounters:  05/16/11 5\' 6"  (1.676 m)    Weight:   Wt Readings from Last 1 Encounters:  10/19/16 113 lb 1.5 oz (51.3 kg)    Ideal Body Weight:  59.1 kg  BMI:  Body mass index is 18.25 kg/m.  Estimated Nutritional Needs:   Kcal:  1300-1500  Protein:  60-75 grams  Fluid:  > 1.3 L  EDUCATION NEEDS:   Education needs addressed  Megyn Leng A. Jimmye Norman, RD, LDN, CDE Pager: [phone] After hours Pager: [phone]

## 2016-10-21 DIAGNOSIS — K625 Hemorrhage of anus and rectum: Secondary | ICD-10-CM | POA: Diagnosis not present

## 2016-10-21 LAB — HEMOGLOBIN AND HEMATOCRIT, BLOOD
HCT: 33.9 % — ABNORMAL LOW (ref 36.0–46.0)
HEMATOCRIT: 32.2 % — AB (ref 36.0–46.0)
HEMOGLOBIN: 11.2 g/dL — AB (ref 12.0–15.0)
Hemoglobin: 11.1 g/dL — ABNORMAL LOW (ref 12.0–15.0)

## 2016-10-21 LAB — BASIC METABOLIC PANEL
Anion gap: 8 (ref 5–15)
CHLORIDE: 104 mmol/L (ref 101–111)
CO2: 28 mmol/L (ref 22–32)
Calcium: 7.8 mg/dL — ABNORMAL LOW (ref 8.9–10.3)
Creatinine, Ser: 0.47 mg/dL (ref 0.44–1.00)
Glucose, Bld: 103 mg/dL — ABNORMAL HIGH (ref 65–99)
POTASSIUM: 3.5 mmol/L (ref 3.5–5.1)
SODIUM: 140 mmol/L (ref 135–145)

## 2016-10-21 LAB — MAGNESIUM: MAGNESIUM: 1.7 mg/dL (ref 1.7–2.4)

## 2016-10-21 MED ORDER — DOCUSATE SODIUM 100 MG PO CAPS: 100.0000 mg | ORAL_CAPSULE | Freq: Two times a day (BID) | ORAL | 2 refills | Status: DC

## 2016-10-21 MED ORDER — SODIUM CHLORIDE 0.9 % IV SOLN
30.0000 meq | Freq: Once | INTRAVENOUS | Status: AC
  Administered 2016-10-21: 30 meq via INTRAVENOUS
  Filled 2016-10-21: qty 15

## 2016-10-21 MED ORDER — ASPIRIN 81 MG PO TABS: 81.0000 mg | ORAL_TABLET | Freq: Every day | ORAL | Status: AC

## 2016-10-21 MED ORDER — POLYETHYLENE GLYCOL 3350 17 G PO PACK: 17.0000 g | PACK | Freq: Every day | ORAL | 3 refills | Status: DC

## 2016-10-21 NOTE — Discharge Summary (Signed)
[facility]  Physician Discharge Summary   Patient ID: Stephanie Bruce MRN: [id number] DOB/AGE: 12-Mar-1925 83 y.o.  Admit date: 10/19/2016 Discharge date: 10/21/2016  PCP: Orpah Melter, MD  DISCHARGE DIAGNOSES:  Active Problems:   Rectal bleed   Hypokalemia   RECOMMENDATIONS FOR OUTPATIENT FOLLOW UP: 1. Patient's son instructed to give the patient stool softeners, laxatives daily. She should have soft stools to avoid further episodes of bleeding.   DISCHARGE CONDITION: fair  Diet recommendation: As before  Filed Weights   10/19/16 1756  Weight: 51.3 kg (113 lb 1.5 oz)    INITIAL HISTORY: 83 year old Caucasian female with a past medical history of advanced dementia, history of stroke, essential hypertension. Lives with her sons. Her sons provide around-the-clock care. Patient is somebody who needs full assistance. She was brought in due to rectal bleeding.   HOSPITAL COURSE:   Rectal bleeding Likely diverticular, review of old imaging records from 2008, shows moderate to severe sigmoid diverticulosis on barium enema. Likely precipitated by constipation. Patient's son reports that she had very hard stools 2 days ago. They had run out of the [place]. This probably triggered the bleeding. Appears to have abated. No reports of any further bleeding.  Hemoglobin has been stable. Patient not a candidate for any invasive testing due to her hands. Dementia. Resume aspirin after 1 week.  Acute blood loss Anemia Hemoglobin has been stable. She did not require any blood transfusions.  Severe Hypokalemia and hypomagnesemia Possibly due to bleeding but also likely due to malnutrition. This was aggressively repleted. Potassium level is normal today. Magnesium level has also improved.  Chronic constipation Patient to take stool softeners and laxatives at home. Prescriptions have been provided.  Lactic acidosis Patient's lactic acid was found to be 2.87, likely from  dehydration. Blood pressure was stable. She was given IV fluids. Lactic acid has corrected. No evidence for any infection. She is afebrile.   Overall, stable. Okay for discharge home today. Discussed in detail with son.   PERTINENT LABS:  The results of significant diagnostics from this hospitalization (including imaging, microbiology, ancillary and laboratory) are listed below for reference.     Labs: Basic Metabolic Panel:  Recent Labs Lab 10/19/16 1356 10/19/16 1405 10/19/16 1432 10/20/16 0553 10/20/16 1844 10/21/16 0528  NA 139 140  --  139 140 140  K 2.5* 2.6*  --  2.2* 3.6 3.5  CL 99* 96*  --  99* 103 104  CO2 31  --   --  30 27 28   GLUCOSE 96 101*  --  110* 211* 103*  BUN 5* 4*  --  <5* 5* <5*  CREATININE 0.53 0.50  --  0.50 0.65 0.47  CALCIUM 7.9*  --   --  7.6* 7.8* 7.8*  MG  --   --  1.6*  --   --  1.7   Liver Function Tests:  Recent Labs Lab 10/19/16 1356 10/20/16 0553  AST 45* 35  ALT 19 18  ALKPHOS 91 87  BILITOT 0.9 1.2  PROT 5.3* 5.4*  ALBUMIN 2.4* 2.2*   CBC:  Recent Labs Lab 10/19/16 1512  10/20/16 0553 10/20/16 1222 10/20/16 1844 10/21/16 0005 10/21/16 0528  WBC 13.0*  --  8.6  --   --   --   --   NEUTROABS 8.4*  --   --   --   --   --   --   HGB 11.9*  < > 11.4* 11.3* 10.9* 11.1* 11.2*  HCT  35.4*  < > 33.6* 33.1* 32.6* 32.2* 33.9*  MCV 88.9  --  89.8  --   --   --   --   PLT 292  --  269  --   --   --   --   < > = values in this interval not displayed.   DISCHARGE EXAMINATION: Vitals:   10/20/16 1438 10/20/16 2122 10/21/16 0608 10/21/16 1327  BP: (!) 138/118 138/81 133/80 (!) 172/137  Pulse: 88 (!) 101 91 99  Resp: 18 16 17 17   Temp: 99.5 F (37.5 C) 99.5 F (37.5 C) 97.8 F (36.6 C) 99.6 F (37.6 C)  TempSrc: Axillary Axillary Axillary Axillary  SpO2: 92% 91% 91% 95%  Weight:       General appearance: alert and distracted Resp: clear to auscultation bilaterally Cardio: regular rate and rhythm, S1, S2 normal, no  murmur, click, rub or gallop GI: soft, non-tender; bowel sounds normal; no masses,  no organomegaly  DISPOSITION: Home with son  Discharge Instructions    Call MD for:  difficulty breathing, headache or visual disturbances    Complete by:  As directed    Call MD for:  extreme fatigue    Complete by:  As directed    Call MD for:  persistant dizziness or light-headedness    Complete by:  As directed    Call MD for:  persistant nausea and vomiting    Complete by:  As directed    Call MD for:  severe uncontrolled pain    Complete by:  As directed    Call MD for:  temperature >100.4    Complete by:  As directed    Discharge instructions    Complete by:  As directed    Please ensure that patient takes stool softeners and laxatives daily. Her stool should ideally be soft to prevent recurrence of bleeding. Diet as before.  You were cared for by a hospitalist during your hospital stay. If you have any questions about your discharge medications or the care you received while you were in the hospital after you are discharged, you can call the unit and asked to speak with the hospitalist on call if the hospitalist that took care of you is not available. Once you are discharged, your primary care physician will handle any further medical issues. Please note that NO REFILLS for any discharge medications will be authorized once you are discharged, as it is imperative that you return to your primary care physician (or establish a relationship with a primary care physician if you do not have one) for your aftercare needs so that they can reassess your need for medications and monitor your lab values. If you do not have a primary care physician, you can call [phone] for a physician referral.   Increase activity slowly    Complete by:  As directed       ALLERGIES:  Allergies  Allergen Reactions  . Zolpidem Tartrate     Pt becomes comatose.  . Banana Other (See Comments)    weak  . Iron Other (See  Comments)    Pt turn black     Current Discharge Medication List    START taking these medications   Details  docusate sodium (COLACE) 100 MG capsule Take 1 capsule (100 mg total) by mouth 2 (two) times daily. Qty: 60 capsule, Refills: 2    polyethylene glycol (MIRALAX) packet Take 17 g by mouth daily. Qty: 30 each, Refills: 3  CONTINUE these medications which have CHANGED   Details  aspirin 81 MG tablet Take 1 tablet (81 mg total) by mouth daily. RESUME AFTER 1 WEEK. Qty: 30 tablet      CONTINUE these medications which have NOT CHANGED   Details  feeding supplement, ENSURE ENLIVE, (ENSURE ENLIVE) LIQD Take 237 mLs by mouth 2 (two) times daily between meals. Qty: 237 mL, Refills: 12    Multiple Vitamins-Minerals (MULTIVITAMIN WITH MINERALS) tablet Take 0.5 tablets by mouth daily.      STOP taking these medications     ciprofloxacin (CIPRO) 500 MG tablet          Follow-up Information    Orpah Melter, MD Follow up.   Specialty:  Family Medicine Why:  AS NEEDED Contact information: [address] [place] [address] [phone]           TOTAL DISCHARGE TIME: 56 minutes  [facility] Pager [phone]  10/21/2016, 2:55 PM

## 2016-10-21 NOTE — Discharge Instructions (Signed)

## 2016-10-21 NOTE — Care Management Note (Signed)
Case Management Note  Patient Details  Name: GRAYLEE ARUTYUNYAN MRN: [id number] Date of Birth: Nov 08, 1924  Subjective/Objective:                    Action/Plan: Late note Spoke to son Lynnzie Blackson at bedside 10-20-16. Him and his brother take care of patient at home.   Patient has hospital bed at home and wheelchair. At discharge John plans to transport his mother home via his private Lucianne Lei.   Will continue to follow.  Expected Discharge Date:                  Expected Discharge Plan:  Home/Self Care  In-House Referral:     Discharge planning Services     Post Acute Care Choice:    Choice offered to:  Adult Children  DME Arranged:    DME Agency:     HH Arranged:    HH Agency:     Status of Service:  In process, will continue to follow  If discussed at Long Length of Stay Meetings, dates discussed:    Additional Comments:  Marilu Favre, RN 10/21/2016, 8:31 AM

## 2016-10-21 NOTE — Progress Notes (Signed)
Patient discharged to home with instructions and prescriptions, transported by her son.

## 2017-03-29 ENCOUNTER — Inpatient Hospital Stay (HOSPITAL_COMMUNITY)
Admission: EM | Admit: 2017-03-29 | Discharge: 2017-04-02 | DRG: 177 | Disposition: A | Discharge status: Home Health | Payer: Medicare Other | Attending: Internal Medicine | Admitting: Internal Medicine

## 2017-03-29 ENCOUNTER — Emergency Department (HOSPITAL_COMMUNITY): Payer: Medicare Other

## 2017-03-29 ENCOUNTER — Encounter (HOSPITAL_COMMUNITY): Payer: Self-pay | Admitting: Emergency Medicine

## 2017-03-29 DIAGNOSIS — E860 Dehydration: Secondary | ICD-10-CM | POA: Diagnosis present

## 2017-03-29 DIAGNOSIS — J690 Pneumonitis due to inhalation of food and vomit: Principal | ICD-10-CM | POA: Diagnosis present

## 2017-03-29 DIAGNOSIS — A419 Sepsis, unspecified organism: Secondary | ICD-10-CM | POA: Diagnosis present

## 2017-03-29 DIAGNOSIS — R6510 Systemic inflammatory response syndrome (SIRS) of non-infectious origin without acute organ dysfunction: Secondary | ICD-10-CM | POA: Diagnosis not present

## 2017-03-29 DIAGNOSIS — E876 Hypokalemia: Secondary | ICD-10-CM | POA: Diagnosis present

## 2017-03-29 DIAGNOSIS — J449 Chronic obstructive pulmonary disease, unspecified: Secondary | ICD-10-CM | POA: Diagnosis present

## 2017-03-29 DIAGNOSIS — E440 Moderate protein-calorie malnutrition: Secondary | ICD-10-CM | POA: Diagnosis not present

## 2017-03-29 DIAGNOSIS — R1110 Vomiting, unspecified: Secondary | ICD-10-CM | POA: Diagnosis present

## 2017-03-29 DIAGNOSIS — D72825 Bandemia: Secondary | ICD-10-CM | POA: Diagnosis not present

## 2017-03-29 DIAGNOSIS — R0902 Hypoxemia: Secondary | ICD-10-CM | POA: Diagnosis not present

## 2017-03-29 DIAGNOSIS — R1111 Vomiting without nausea: Secondary | ICD-10-CM

## 2017-03-29 DIAGNOSIS — K5900 Constipation, unspecified: Secondary | ICD-10-CM | POA: Diagnosis not present

## 2017-03-29 DIAGNOSIS — Z7401 Bed confinement status: Secondary | ICD-10-CM

## 2017-03-29 DIAGNOSIS — R251 Tremor, unspecified: Secondary | ICD-10-CM | POA: Diagnosis present

## 2017-03-29 DIAGNOSIS — G9340 Encephalopathy, unspecified: Secondary | ICD-10-CM | POA: Diagnosis present

## 2017-03-29 DIAGNOSIS — R0600 Dyspnea, unspecified: Secondary | ICD-10-CM | POA: Diagnosis present

## 2017-03-29 DIAGNOSIS — R7989 Other specified abnormal findings of blood chemistry: Secondary | ICD-10-CM | POA: Diagnosis not present

## 2017-03-29 DIAGNOSIS — Z87891 Personal history of nicotine dependence: Secondary | ICD-10-CM

## 2017-03-29 DIAGNOSIS — Z91018 Allergy to other foods: Secondary | ICD-10-CM

## 2017-03-29 DIAGNOSIS — R6520 Severe sepsis without septic shock: Secondary | ICD-10-CM

## 2017-03-29 DIAGNOSIS — Z8673 Personal history of transient ischemic attack (TIA), and cerebral infarction without residual deficits: Secondary | ICD-10-CM

## 2017-03-29 DIAGNOSIS — M1990 Unspecified osteoarthritis, unspecified site: Secondary | ICD-10-CM | POA: Diagnosis present

## 2017-03-29 DIAGNOSIS — F0390 Unspecified dementia without behavioral disturbance: Secondary | ICD-10-CM | POA: Diagnosis not present

## 2017-03-29 DIAGNOSIS — Z7189 Other specified counseling: Secondary | ICD-10-CM

## 2017-03-29 DIAGNOSIS — J189 Pneumonia, unspecified organism: Secondary | ICD-10-CM

## 2017-03-29 DIAGNOSIS — Z888 Allergy status to other drugs, medicaments and biological substances status: Secondary | ICD-10-CM

## 2017-03-29 DIAGNOSIS — Z7982 Long term (current) use of aspirin: Secondary | ICD-10-CM

## 2017-03-29 DIAGNOSIS — I10 Essential (primary) hypertension: Secondary | ICD-10-CM | POA: Diagnosis present

## 2017-03-29 DIAGNOSIS — D72829 Elevated white blood cell count, unspecified: Secondary | ICD-10-CM | POA: Diagnosis present

## 2017-03-29 DIAGNOSIS — Z515 Encounter for palliative care: Secondary | ICD-10-CM | POA: Diagnosis present

## 2017-03-29 DIAGNOSIS — Z681 Body mass index (BMI) 19 or less, adult: Secondary | ICD-10-CM

## 2017-03-29 DIAGNOSIS — R4701 Aphasia: Secondary | ICD-10-CM | POA: Diagnosis present

## 2017-03-29 DIAGNOSIS — E872 Acidosis: Secondary | ICD-10-CM | POA: Diagnosis present

## 2017-03-29 LAB — CBC WITH DIFFERENTIAL/PLATELET
Basophils Absolute: 0 10*3/uL (ref 0.0–0.1)
Basophils Relative: 0 %
EOS ABS: 0 10*3/uL (ref 0.0–0.7)
Eosinophils Relative: 0 %
HCT: 38 % (ref 36.0–46.0)
HEMOGLOBIN: 12.3 g/dL (ref 12.0–15.0)
LYMPHS PCT: 3 %
Lymphs Abs: 0.8 10*3/uL (ref 0.7–4.0)
MCH: 28.1 pg (ref 26.0–34.0)
MCHC: 32.4 g/dL (ref 30.0–36.0)
MCV: 86.8 fL (ref 78.0–100.0)
MONO ABS: 1.1 10*3/uL — AB (ref 0.1–1.0)
Monocytes Relative: 4 %
NEUTROS PCT: 93 %
Neutro Abs: 24.6 10*3/uL — ABNORMAL HIGH (ref 1.7–7.7)
PLATELETS: 289 10*3/uL (ref 150–400)
RBC: 4.38 MIL/uL (ref 3.87–5.11)
RDW: 16 % — ABNORMAL HIGH (ref 11.5–15.5)
WBC: 26.5 10*3/uL — ABNORMAL HIGH (ref 4.0–10.5)

## 2017-03-29 LAB — URINALYSIS, ROUTINE W REFLEX MICROSCOPIC
Bilirubin Urine: NEGATIVE
GLUCOSE, UA: NEGATIVE mg/dL
Ketones, ur: NEGATIVE mg/dL
Leukocytes, UA: NEGATIVE
NITRITE: NEGATIVE
PH: 6 (ref 5.0–8.0)
Protein, ur: NEGATIVE mg/dL
SPECIFIC GRAVITY, URINE: 1.006 (ref 1.005–1.030)

## 2017-03-29 LAB — COMPREHENSIVE METABOLIC PANEL
ALBUMIN: 3.1 g/dL — AB (ref 3.5–5.0)
ALT: 42 U/L (ref 14–54)
ANION GAP: 14 (ref 5–15)
AST: 75 U/L — ABNORMAL HIGH (ref 15–41)
Alkaline Phosphatase: 105 U/L (ref 38–126)
BUN: 7 mg/dL (ref 6–20)
CHLORIDE: 102 mmol/L (ref 101–111)
CO2: 19 mmol/L — AB (ref 22–32)
Calcium: 8.5 mg/dL — ABNORMAL LOW (ref 8.9–10.3)
Creatinine, Ser: 0.97 mg/dL (ref 0.44–1.00)
GFR calc non Af Amer: 50 mL/min — ABNORMAL LOW (ref >60)
GFR, EST AFRICAN AMERICAN: 58 mL/min — AB (ref >60)
Glucose, Bld: 227 mg/dL — ABNORMAL HIGH (ref 65–99)
Potassium: 3 mmol/L — ABNORMAL LOW (ref 3.5–5.1)
SODIUM: 135 mmol/L (ref 135–145)
Total Bilirubin: 0.9 mg/dL (ref 0.3–1.2)
Total Protein: 6.8 g/dL (ref 6.5–8.1)

## 2017-03-29 LAB — I-STAT CG4 LACTIC ACID, ED: LACTIC ACID, VENOUS: 6.86 mmol/L — AB (ref 0.5–1.9)

## 2017-03-29 LAB — AMMONIA: AMMONIA: 37 umol/L — AB (ref 9–35)

## 2017-03-29 MED ORDER — PIPERACILLIN-TAZOBACTAM 3.375 G IVPB 30 MIN
3.3750 g | Freq: Once | INTRAVENOUS | Status: AC
  Administered 2017-03-29: 3.375 g via INTRAVENOUS
  Filled 2017-03-29: qty 50

## 2017-03-29 MED ORDER — IOPAMIDOL (ISOVUE-300) INJECTION 61%
INTRAVENOUS | Status: AC
  Administered 2017-03-30: 100 mL
  Filled 2017-03-29: qty 100

## 2017-03-29 MED ORDER — SODIUM CHLORIDE 0.9 % IV BOLUS (SEPSIS)
500.0000 mL | Freq: Once | INTRAVENOUS | Status: AC
  Administered 2017-03-29: 500 mL via INTRAVENOUS

## 2017-03-29 MED ORDER — PIPERACILLIN-TAZOBACTAM 3.375 G IVPB
3.3750 g | Freq: Three times a day (TID) | INTRAVENOUS | Status: DC
  Administered 2017-03-30 – 2017-04-01 (×7): 3.375 g via INTRAVENOUS
  Filled 2017-03-29 (×9): qty 50

## 2017-03-29 MED ORDER — VANCOMYCIN HCL IN DEXTROSE 1-5 GM/200ML-% IV SOLN
1000.0000 mg | Freq: Once | INTRAVENOUS | Status: AC
  Administered 2017-03-29: 1000 mg via INTRAVENOUS
  Filled 2017-03-29: qty 200

## 2017-03-29 MED ORDER — SODIUM CHLORIDE 0.9 % IV BOLUS (SEPSIS)
1000.0000 mL | Freq: Once | INTRAVENOUS | Status: AC
  Administered 2017-03-29: 1000 mL via INTRAVENOUS

## 2017-03-29 MED ORDER — IOPAMIDOL (ISOVUE-300) INJECTION 61%
INTRAVENOUS | Status: AC
  Filled 2017-03-29: qty 30

## 2017-03-29 MED ORDER — VANCOMYCIN HCL IN DEXTROSE 1-5 GM/200ML-% IV SOLN: 1000.0000 mg | INTRAVENOUS | Status: DC

## 2017-03-29 NOTE — ED Notes (Addendum)
Pts 2 sons now at bedside reporting pt experienced "shaking like she was cold", "shaking all over"; pt then began to vomit; Thomasene Lot, MD at bedside and also aware

## 2017-03-29 NOTE — ED Notes (Addendum)
Pt arrives to [facility] ED D hallway; Thomasene Lot, MD at bedside

## 2017-03-29 NOTE — ED Notes (Signed)
4mg  zofran given via PIV in CT2

## 2017-03-29 NOTE — ED Notes (Signed)
Blood cultures set #2 drawn from R hand

## 2017-03-29 NOTE — Progress Notes (Addendum)
Pharmacy Antibiotic Note  Stephanie Bruce is a 83 y.o. female admitted on 03/29/2017 with AMS, shaking and episodes of emesis. Starting broad spectrum abx for rule out sepsis. LA 6.86, WBC 26, TM 102.1.   Plan: -Vancomycin 1 g IV x1 then 1g/48h -Zosyn 3.375 g IV x1 then 3.375 g IV q8h -Monitor renal fx, cultures, VR as indicated     Temp (24hrs), Avg:102.1 F (38.9 C), Min:102.1 F (38.9 C), Max:102.1 F (38.9 C)   Recent Labs Lab 03/29/17 2047 03/29/17 2124  WBC 26.5*  --   LATICACIDVEN  --  6.86*    CrCl cannot be calculated (Patient's most recent lab result is older than the maximum 21 days allowed.).    Allergies  Allergen Reactions  . Zolpidem Tartrate     Pt becomes comatose.  . Banana Other (See Comments)    weak  . Iron Other (See Comments)    Pt turn black    Antimicrobials this admission: 9/26 vancomycin > 9/26 zosyn >  Dose adjustments this admission: N/A  Microbiology results: 9/26 blood cx:   Stephanie Bruce 03/29/2017 9:48 PM

## 2017-03-29 NOTE — ED Triage Notes (Signed)
Pt presents from home with [facility] for AMS per family; family reports advanced dementia but while patient was eating dinner she wasn't acting herself; EMS reports R sided gaze, warm to touch; pt arrived to [facility] ED and began vomiting (total of 5 episodes of emesis since arrival); pts 4 extremities are contracted; [place], MD saw patient on arrival and ordered a stat head CT
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
## 2017-03-29 NOTE — ED Notes (Signed)
Pt to [place] with [facility], RN and EMT
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
## 2017-03-30 ENCOUNTER — Emergency Department (HOSPITAL_COMMUNITY): Payer: Medicare Other

## 2017-03-30 ENCOUNTER — Inpatient Hospital Stay (HOSPITAL_COMMUNITY): Payer: Medicare Other

## 2017-03-30 DIAGNOSIS — R4701 Aphasia: Secondary | ICD-10-CM | POA: Diagnosis present

## 2017-03-30 DIAGNOSIS — Z515 Encounter for palliative care: Secondary | ICD-10-CM | POA: Diagnosis present

## 2017-03-30 DIAGNOSIS — E440 Moderate protein-calorie malnutrition: Secondary | ICD-10-CM | POA: Diagnosis present

## 2017-03-30 DIAGNOSIS — F0150 Vascular dementia without behavioral disturbance: Secondary | ICD-10-CM | POA: Diagnosis not present

## 2017-03-30 DIAGNOSIS — A419 Sepsis, unspecified organism: Secondary | ICD-10-CM | POA: Diagnosis not present

## 2017-03-30 DIAGNOSIS — M1990 Unspecified osteoarthritis, unspecified site: Secondary | ICD-10-CM | POA: Diagnosis present

## 2017-03-30 DIAGNOSIS — Z7189 Other specified counseling: Secondary | ICD-10-CM | POA: Diagnosis not present

## 2017-03-30 DIAGNOSIS — Z888 Allergy status to other drugs, medicaments and biological substances status: Secondary | ICD-10-CM | POA: Diagnosis not present

## 2017-03-30 DIAGNOSIS — R6520 Severe sepsis without septic shock: Secondary | ICD-10-CM | POA: Diagnosis not present

## 2017-03-30 DIAGNOSIS — D72829 Elevated white blood cell count, unspecified: Secondary | ICD-10-CM | POA: Diagnosis not present

## 2017-03-30 DIAGNOSIS — J449 Chronic obstructive pulmonary disease, unspecified: Secondary | ICD-10-CM | POA: Diagnosis present

## 2017-03-30 DIAGNOSIS — R251 Tremor, unspecified: Secondary | ICD-10-CM | POA: Diagnosis present

## 2017-03-30 DIAGNOSIS — J690 Pneumonitis due to inhalation of food and vomit: Secondary | ICD-10-CM | POA: Diagnosis present

## 2017-03-30 DIAGNOSIS — E876 Hypokalemia: Secondary | ICD-10-CM | POA: Diagnosis present

## 2017-03-30 DIAGNOSIS — Z7401 Bed confinement status: Secondary | ICD-10-CM | POA: Diagnosis not present

## 2017-03-30 DIAGNOSIS — I10 Essential (primary) hypertension: Secondary | ICD-10-CM | POA: Diagnosis present

## 2017-03-30 DIAGNOSIS — K5900 Constipation, unspecified: Secondary | ICD-10-CM | POA: Diagnosis present

## 2017-03-30 DIAGNOSIS — R7989 Other specified abnormal findings of blood chemistry: Secondary | ICD-10-CM | POA: Diagnosis not present

## 2017-03-30 DIAGNOSIS — E860 Dehydration: Secondary | ICD-10-CM | POA: Diagnosis present

## 2017-03-30 DIAGNOSIS — E872 Acidosis: Secondary | ICD-10-CM | POA: Diagnosis present

## 2017-03-30 DIAGNOSIS — Z8673 Personal history of transient ischemic attack (TIA), and cerebral infarction without residual deficits: Secondary | ICD-10-CM | POA: Diagnosis not present

## 2017-03-30 DIAGNOSIS — R0902 Hypoxemia: Secondary | ICD-10-CM | POA: Diagnosis not present

## 2017-03-30 DIAGNOSIS — G9340 Encephalopathy, unspecified: Secondary | ICD-10-CM | POA: Diagnosis present

## 2017-03-30 DIAGNOSIS — Z681 Body mass index (BMI) 19 or less, adult: Secondary | ICD-10-CM | POA: Diagnosis not present

## 2017-03-30 DIAGNOSIS — Z7982 Long term (current) use of aspirin: Secondary | ICD-10-CM | POA: Diagnosis not present

## 2017-03-30 DIAGNOSIS — F0390 Unspecified dementia without behavioral disturbance: Secondary | ICD-10-CM | POA: Diagnosis not present

## 2017-03-30 DIAGNOSIS — R6510 Systemic inflammatory response syndrome (SIRS) of non-infectious origin without acute organ dysfunction: Secondary | ICD-10-CM | POA: Diagnosis present

## 2017-03-30 DIAGNOSIS — Z87891 Personal history of nicotine dependence: Secondary | ICD-10-CM | POA: Diagnosis not present

## 2017-03-30 DIAGNOSIS — R0600 Dyspnea, unspecified: Secondary | ICD-10-CM | POA: Diagnosis not present

## 2017-03-30 LAB — MAGNESIUM
MAGNESIUM: 1.7 mg/dL (ref 1.7–2.4)
Magnesium: 1.6 mg/dL — ABNORMAL LOW (ref 1.7–2.4)

## 2017-03-30 LAB — COMPREHENSIVE METABOLIC PANEL
ALBUMIN: 2.6 g/dL — AB (ref 3.5–5.0)
ALT: 32 U/L (ref 14–54)
ANION GAP: 10 (ref 5–15)
AST: 54 U/L — ABNORMAL HIGH (ref 15–41)
Alkaline Phosphatase: 79 U/L (ref 38–126)
BUN: 6 mg/dL (ref 6–20)
CHLORIDE: 106 mmol/L (ref 101–111)
CO2: 22 mmol/L (ref 22–32)
CREATININE: 0.75 mg/dL (ref 0.44–1.00)
Calcium: 8 mg/dL — ABNORMAL LOW (ref 8.9–10.3)
Glucose, Bld: 144 mg/dL — ABNORMAL HIGH (ref 65–99)
POTASSIUM: 3.8 mmol/L (ref 3.5–5.1)
SODIUM: 138 mmol/L (ref 135–145)
Total Bilirubin: 1.2 mg/dL (ref 0.3–1.2)
Total Protein: 5.7 g/dL — ABNORMAL LOW (ref 6.5–8.1)

## 2017-03-30 LAB — MRSA PCR SCREENING: MRSA BY PCR: NEGATIVE

## 2017-03-30 LAB — DIFFERENTIAL
BASOS ABS: 0 10*3/uL (ref 0.0–0.1)
BASOS PCT: 0 %
EOS PCT: 0 %
Eosinophils Absolute: 0 10*3/uL (ref 0.0–0.7)
LYMPHS PCT: 7 %
Lymphs Abs: 2.3 10*3/uL (ref 0.7–4.0)
MONOS PCT: 5 %
Monocytes Absolute: 1.7 10*3/uL — ABNORMAL HIGH (ref 0.1–1.0)
NEUTROS PCT: 88 %
Neutro Abs: 29.3 10*3/uL — ABNORMAL HIGH (ref 1.7–7.7)

## 2017-03-30 LAB — RESPIRATORY PANEL BY PCR
ADENOVIRUS-RVPPCR: NOT DETECTED
Bordetella pertussis: NOT DETECTED
CHLAMYDOPHILA PNEUMONIAE-RVPPCR: NOT DETECTED
CORONAVIRUS 229E-RVPPCR: NOT DETECTED
CORONAVIRUS HKU1-RVPPCR: NOT DETECTED
CORONAVIRUS NL63-RVPPCR: NOT DETECTED
Coronavirus OC43: NOT DETECTED
Influenza A: NOT DETECTED
Influenza B: NOT DETECTED
MYCOPLASMA PNEUMONIAE-RVPPCR: NOT DETECTED
Metapneumovirus: NOT DETECTED
PARAINFLUENZA VIRUS 3-RVPPCR: NOT DETECTED
Parainfluenza Virus 1: NOT DETECTED
Parainfluenza Virus 2: NOT DETECTED
Parainfluenza Virus 4: NOT DETECTED
Respiratory Syncytial Virus: NOT DETECTED
Rhinovirus / Enterovirus: NOT DETECTED

## 2017-03-30 LAB — CBC
HEMATOCRIT: 34.3 % — AB (ref 36.0–46.0)
HEMOGLOBIN: 11.1 g/dL — AB (ref 12.0–15.0)
MCH: 28 pg (ref 26.0–34.0)
MCHC: 32.4 g/dL (ref 30.0–36.0)
MCV: 86.4 fL (ref 78.0–100.0)
Platelets: 175 10*3/uL (ref 150–400)
RBC: 3.97 MIL/uL (ref 3.87–5.11)
RDW: 15.8 % — ABNORMAL HIGH (ref 11.5–15.5)
WBC: 33.3 10*3/uL — AB (ref 4.0–10.5)

## 2017-03-30 LAB — POCT I-STAT TROPONIN I: TROPONIN I, POC: 0.02 ng/mL (ref 0.00–0.08)

## 2017-03-30 LAB — TROPONIN I
TROPONIN I: 0.03 ng/mL — AB (ref <0.03)
TROPONIN I: 0.03 ng/mL — AB (ref <0.03)

## 2017-03-30 LAB — TSH: TSH: 0.909 u[IU]/mL (ref 0.350–4.500)

## 2017-03-30 LAB — HEMOGLOBIN A1C
Hgb A1c MFr Bld: 5.7 % — ABNORMAL HIGH (ref 4.8–5.6)
Mean Plasma Glucose: 116.89 mg/dL

## 2017-03-30 LAB — APTT: aPTT: 21 seconds — ABNORMAL LOW (ref 24–36)

## 2017-03-30 LAB — PROCALCITONIN: PROCALCITONIN: 7.83 ng/mL

## 2017-03-30 LAB — LACTIC ACID, PLASMA
LACTIC ACID, VENOUS: 3.8 mmol/L — AB (ref 0.5–1.9)
LACTIC ACID, VENOUS: 3.5 mmol/L — AB (ref 0.5–1.9)
Lactic Acid, Venous: 4.4 mmol/L (ref 0.5–1.9)
Lactic Acid, Venous: 1.6 mmol/L (ref 0.5–1.9)

## 2017-03-30 LAB — PROTIME-INR
INR: 1.04
Prothrombin Time: 13.6 seconds (ref 11.4–15.2)

## 2017-03-30 LAB — PHOSPHORUS
Phosphorus: 2.8 mg/dL (ref 2.5–4.6)
Phosphorus: 2.2 mg/dL — ABNORMAL LOW (ref 2.5–4.6)

## 2017-03-30 LAB — INFLUENZA PANEL BY PCR (TYPE A & B)
INFLAPCR: NEGATIVE
INFLBPCR: NEGATIVE

## 2017-03-30 MED ORDER — VANCOMYCIN HCL IN DEXTROSE 750-5 MG/150ML-% IV SOLN
750.0000 mg | INTRAVENOUS | Status: DC
  Administered 2017-03-30: 750 mg via INTRAVENOUS
  Filled 2017-03-30: qty 150

## 2017-03-30 MED ORDER — VITAMIN B-1 100 MG PO TABS
100.0000 mg | ORAL_TABLET | Freq: Every day | ORAL | Status: DC
  Administered 2017-03-30 – 2017-03-31 (×2): 100 mg via ORAL
  Filled 2017-03-30 (×2): qty 1

## 2017-03-30 MED ORDER — ONDANSETRON HCL 4 MG PO TABS: 4.0000 mg | ORAL_TABLET | Freq: Four times a day (QID) | ORAL | Status: DC | PRN

## 2017-03-30 MED ORDER — SENNA 8.6 MG PO TABS
1.0000 | ORAL_TABLET | Freq: Two times a day (BID) | ORAL | Status: DC
  Administered 2017-03-30 – 2017-03-31 (×2): 8.6 mg via ORAL
  Filled 2017-03-30 (×2): qty 1

## 2017-03-30 MED ORDER — MAGNESIUM SULFATE 2 GM/50ML IV SOLN
2.0000 g | Freq: Once | INTRAVENOUS | Status: AC
  Administered 2017-03-30: 2 g via INTRAVENOUS
  Filled 2017-03-30: qty 50

## 2017-03-30 MED ORDER — SODIUM CHLORIDE 0.9 % IV SOLN
INTRAVENOUS | Status: DC
  Administered 2017-03-30: 02:00:00 via INTRAVENOUS

## 2017-03-30 MED ORDER — ONDANSETRON HCL 4 MG/2ML IJ SOLN
4.0000 mg | Freq: Four times a day (QID) | INTRAMUSCULAR | Status: DC | PRN
  Administered 2017-03-30: 4 mg via INTRAVENOUS
  Filled 2017-03-30: qty 2

## 2017-03-30 MED ORDER — ENSURE ENLIVE PO LIQD
237.0000 mL | Freq: Three times a day (TID) | ORAL | Status: DC
  Administered 2017-03-30 – 2017-04-02 (×4): 237 mL via ORAL

## 2017-03-30 MED ORDER — SODIUM CHLORIDE 0.9 % IV SOLN
INTRAVENOUS | Status: DC
  Administered 2017-03-31 – 2017-04-01 (×2): via INTRAVENOUS

## 2017-03-30 MED ORDER — POLYETHYLENE GLYCOL 3350 17 G PO PACK: 17.0000 g | PACK | Freq: Every day | ORAL | Status: DC | PRN

## 2017-03-30 MED ORDER — POTASSIUM CHLORIDE 10 MEQ/100ML IV SOLN
10.0000 meq | INTRAVENOUS | Status: AC
  Administered 2017-03-30 (×4): 10 meq via INTRAVENOUS
  Filled 2017-03-30 (×4): qty 100

## 2017-03-30 MED ORDER — SODIUM CHLORIDE 0.9 % IV BOLUS (SEPSIS)
1000.0000 mL | Freq: Once | INTRAVENOUS | Status: AC
  Administered 2017-03-30: 1000 mL via INTRAVENOUS

## 2017-03-30 MED ORDER — MILK AND MOLASSES ENEMA
1.0000 | Freq: Once | RECTAL | Status: AC
  Administered 2017-03-30: 250 mL via RECTAL
  Filled 2017-03-30: qty 250

## 2017-03-30 MED ORDER — BISACODYL 10 MG RE SUPP: 10.0000 mg | Freq: Every day | RECTAL | Status: DC | PRN

## 2017-03-30 MED ORDER — ADULT MULTIVITAMIN W/MINERALS CH
1.0000 | ORAL_TABLET | Freq: Every day | ORAL | Status: DC
  Administered 2017-03-31: 1 via ORAL
  Filled 2017-03-30 (×2): qty 1

## 2017-03-30 MED ORDER — FOLIC ACID 1 MG PO TABS
1.0000 mg | ORAL_TABLET | Freq: Every day | ORAL | Status: DC
  Administered 2017-03-30 – 2017-03-31 (×2): 1 mg via ORAL
  Filled 2017-03-30 (×2): qty 1

## 2017-03-30 MED ORDER — ENOXAPARIN SODIUM 30 MG/0.3ML ~~LOC~~ SOLN
30.0000 mg | SUBCUTANEOUS | Status: DC
  Administered 2017-03-30 – 2017-04-02 (×4): 30 mg via SUBCUTANEOUS
  Filled 2017-03-30 (×4): qty 0.3

## 2017-03-30 MED ORDER — ORAL CARE MOUTH RINSE
15.0000 mL | Freq: Two times a day (BID) | OROMUCOSAL | Status: DC
  Administered 2017-03-31 – 2017-04-02 (×5): 15 mL via OROMUCOSAL

## 2017-03-30 NOTE — Progress Notes (Signed)
PROGRESS NOTE    Stephanie Bruce  VJP:[id number] DOB: 03-Oct-1924 DOA: 03/29/2017 PCP: Joycelyn Rua, MD   Brief Narrative:   83 y.o. WF PMHx advanced Dementia, CVA, HTN, syncope, COPD, HLD, Nephrolithiasis, Chronic Contractures. ,    Presented with patient per family has been more confused than her baseline have had some tremors. She started to not act like herself during eating dinner she felt somewhat warm to the touch and was shaking all over. Her family states her nose was running. Her blood pressure was elevated at home.  They called EMS on arrival patient  Vomited then became a bit more responsive she was looking around. She has had a total 5 episodes of vomiting.  She is nonverbal at baseline no seizure activity weakness by family or EMS. Family did not endorse any fevers or chills  Recently admitted to the diverticular rectal bleeding in April 2018 has history of diverticulosis in the past. During prior admission she was found to have lactic acidosis of 2.87 felt to be secondary to dehydration and a corrected with IV fluids.     Subjective: 9/27 eyes open, does not follow commands, nonverbal, severe contracture of all 4 extremities. C-spine contracted to the right. Grimaces with painful stimuli does not withdraw.   Assessment & Plan:   Active Problems:   Dementia   Leukocytosis   Essential hypertension   Malnutrition of moderate degree (HCC)   Vomiting alone   Hypokalemia   Severe sepsis (HCC)   Elevated lactic acid level   Acute encephalopathy   SIRS (systemic inflammatory response syndrome) (HCC)   Dyspnea   Sepsis (HCC)   Obstipation  Severe Sepsis -upon admission met criteria for sepsis: WBC> 12,temp>38 C, HR> 90, RR> 20 -Bolus 1 L normal saline then normal saline 1109ml/hr -Continue empiric antibiotics -Pan culture pending  -Counseled That at Her Age Patient with Severe Sepsis Mortality Nearly 100%.  Lactic acidosis -see severe sepsis  Acute  encephalopathy/Dementia  -Per son at baseline non-verbal, bedbound watches TV all day.  Essential HTN -Not on any home medication -Will allow permissive HTN, secondary agent sepsis: Appears to be trending down -If still significantly elevated on 9/28 wouldn't add a medication.  Obstipation -Resolved with enema  Moderate protein calorie Malnutrition -Requested CorTrak placement. Patient clearly not maintaining appropriate caloric intake. -Nutrition consult placed  Hypokalemia -potassium IV 40 mEq  Hypomagnesemia -Magnesium IV2 g   Goals of care -9/27 counseled son Jonny Ruiz although they want full code after examining patient given her long time contracture, would be impossible to safely intubate her if she was to get into acute respiratory failure without having a significant risk of compromising her C-spine. Therefore we will not risk intubation.   DVT prophylaxis: Lovenox Code Status: full: Patient with significant contracture including her head chronically contracted to the right onto her shoulder would not attempt intubation, significant risk of C-spine compromise. Family Communication: John her son at bedside for discussion of plan of care Disposition Plan: TBD    Consultants:  None  Procedures/Significant Events:  9/26 CT head WO contrast:-No intraparenchymal hemorrhage.-Severe ventriculomegaly on the basis of parenchymal brain volume loss.- Patchy supratentorial white matter hypodensities.  -Old LEFT basal ganglia lacunar infarct. 9/26 PCXR: Atelectasis Echocardiogram pending   I have personally reviewed and interpreted all radiology studies and my findings are as above.  VENTILATOR SETTINGS: None   Cultures 9/26 blood NGTD 9/27 MRSA by PCR negative 9/27 respiratory virus panel negative  9/27 influenza A and B by  PCR negative   Antimicrobials: Anti-infectives    Start     Dose/Rate Stop   03/31/17 2200  vancomycin (VANCOCIN) IVPB 1000 mg/200 mL premix      1,000 mg 200 mL/hr over 60 Minutes     03/30/17 0400  piperacillin-tazobactam (ZOSYN) IVPB 3.375 g     3.375 g 12.5 mL/hr over 240 Minutes     03/29/17 2145  piperacillin-tazobactam (ZOSYN) IVPB 3.375 g     3.375 g 100 mL/hr over 30 Minutes 03/29/17 2310   03/29/17 2145  vancomycin (VANCOCIN) IVPB 1000 mg/200 mL premix     1,000 mg 200 mL/hr over 60 Minutes 03/29/17 2310       Devices    LINES / TUBES:      Continuous Infusions: . sodium chloride 100 mL/hr at 03/30/17 0527  . piperacillin-tazobactam (ZOSYN)  IV 3.375 g (03/30/17 0511)  . potassium chloride 10 mEq (03/30/17 0648)  . [START ON 03/31/2017] vancomycin       Objective: Vitals:   03/30/17 0150 03/30/17 0315 03/30/17 0344 03/30/17 0400  BP: (!) 162/70 (!) 163/112 (!) 180/72 (!) 140/97  Pulse: 98 (!) 108 (!) 106 (!) 104  Resp: (!) 22 (!) 26 (!) 21 19  Temp: 99.9 F (37.7 C) 99.8 F (37.7 C)    TempSrc: Axillary Axillary    SpO2: 94% 94% 95% 97%  Weight:      Height:        Intake/Output Summary (Last 24 hours) at 03/30/17 5993 Last data filed at 03/30/17 0400  Gross per 24 hour  Intake           3532.5 ml  Output                0 ml  Net           3532.5 ml   Filed Weights   03/29/17 2132 03/30/17 0148  Weight: 113 lb 1.5 oz (51.3 kg) 108 lb 11 oz (49.3 kg)    Examination:  General: A/O 0, does not follow commands, nonverbal,No acute respiratory distress Neck:  Negative scars, masses, positive torticollis, lymphadenopathy, JVD Lungs: tachypnea,Clear to auscultation bilaterally without wheezes or crackles Cardiovascular: tachycardic,Regular rhythm without murmur gallop or rub normal S1 and S2 Abdomen: negative abdominal pain, nondistended, positive soft, bowel sounds, no rebound, no ascites, no appreciable mass Extremities: all 4 extremities contracted Skin: Negative rashes, lesions, ulcers Psychiatric:  Unable to evaluate secondary to AMS/dementia Central nervous system:  Grimaces slightly  with painful stimuli, unable to evaluate further secondary to AMS/dementia .     Data Reviewed: Care during the described time interval was provided by me .  I have reviewed this patient's available data, including medical history, events of note, physical examination, and all test results as part of my evaluation.   CBC:  Recent Labs Lab 03/29/17 2047  WBC 26.5*  NEUTROABS 24.6*  HGB 12.3  HCT 38.0  MCV 86.8  PLT 570   Basic Metabolic Panel:  Recent Labs Lab 03/29/17 2047 03/30/17 0309  NA 135  --   K 3.0*  --   CL 102  --   CO2 19*  --   GLUCOSE 227*  --   BUN 7  --   CREATININE 0.97  --   CALCIUM 8.5*  --   MG  --  1.7  PHOS  --  2.8   GFR: Estimated Creatinine Clearance: 30 mL/min (by C-G formula based on SCr of 0.97 mg/dL). Liver Function Tests:  Recent Labs Lab 03/29/17 2047  AST 75*  ALT 42  ALKPHOS 105  BILITOT 0.9  PROT 6.8  ALBUMIN 3.1*   No results for input(s): LIPASE, AMYLASE in the last 168 hours.  Recent Labs Lab 03/29/17 2047  AMMONIA 37*   Coagulation Profile:  Recent Labs Lab 03/30/17 0309  INR 1.04   Cardiac Enzymes:  Recent Labs Lab 03/30/17 0000  TROPONINI <0.03   BNP (last 3 results) No results for input(s): PROBNP in the last 8760 hours. HbA1C:  Recent Labs  03/30/17 0309  HGBA1C 5.7*   CBG: No results for input(s): GLUCAP in the last 168 hours. Lipid Profile: No results for input(s): CHOL, HDL, LDLCALC, TRIG, CHOLHDL, LDLDIRECT in the last 72 hours. Thyroid Function Tests:  Recent Labs  03/30/17 0418  TSH 0.909   Anemia Panel: No results for input(s): VITAMINB12, FOLATE, FERRITIN, TIBC, IRON, RETICCTPCT in the last 72 hours. Urine analysis:    Component Value Date/Time   COLORURINE STRAW (A) 03/29/2017 2320   APPEARANCEUR CLEAR 03/29/2017 2320   LABSPEC 1.006 03/29/2017 2320   PHURINE 6.0 03/29/2017 2320   GLUCOSEU NEGATIVE 03/29/2017 2320   HGBUR SMALL (A) 03/29/2017 2320   BILIRUBINUR  NEGATIVE 03/29/2017 2320   KETONESUR NEGATIVE 03/29/2017 2320   PROTEINUR NEGATIVE 03/29/2017 2320   UROBILINOGEN 1.0 11/10/2014 1121   NITRITE NEGATIVE 03/29/2017 2320   LEUKOCYTESUR NEGATIVE 03/29/2017 2320   Sepsis Labs: _0 (procalcitonin:4,lacticidven:4)  ) Recent Results (from the past 240 hour(s))  Respiratory Panel by PCR     Status: None   Collection Time: 03/30/17  2:00 AM  Result Value Ref Range Status   Adenovirus NOT DETECTED NOT DETECTED Final   Coronavirus 229E NOT DETECTED NOT DETECTED Final   Coronavirus HKU1 NOT DETECTED NOT DETECTED Final   Coronavirus NL63 NOT DETECTED NOT DETECTED Final   Coronavirus OC43 NOT DETECTED NOT DETECTED Final   Metapneumovirus NOT DETECTED NOT DETECTED Final   Rhinovirus / Enterovirus NOT DETECTED NOT DETECTED Final   Influenza A NOT DETECTED NOT DETECTED Final   Influenza B NOT DETECTED NOT DETECTED Final   Parainfluenza Virus 1 NOT DETECTED NOT DETECTED Final   Parainfluenza Virus 2 NOT DETECTED NOT DETECTED Final   Parainfluenza Virus 3 NOT DETECTED NOT DETECTED Final   Parainfluenza Virus 4 NOT DETECTED NOT DETECTED Final   Respiratory Syncytial Virus NOT DETECTED NOT DETECTED Final   Bordetella pertussis NOT DETECTED NOT DETECTED Final   Chlamydophila pneumoniae NOT DETECTED NOT DETECTED Final   Mycoplasma pneumoniae NOT DETECTED NOT DETECTED Final  MRSA PCR Screening     Status: None   Collection Time: 03/30/17  2:00 AM  Result Value Ref Range Status   MRSA by PCR NEGATIVE NEGATIVE Final    Comment:        The GeneXpert MRSA Assay (FDA approved for NASAL specimens only), is one component of a comprehensive MRSA colonization surveillance program. It is not intended to diagnose MRSA infection nor to guide or monitor treatment for MRSA infections.          Radiology Studies: Ct Head Wo Contrast  Result Date: 03/29/2017 CLINICAL DATA:  Respiratory failure, altered mental status. EXAM: CT HEAD WITHOUT  CONTRAST TECHNIQUE: Contiguous axial images were obtained from the base of the skull through the vertex without intravenous contrast. COMPARISON:  CT HEAD Nov 10, 2014 FINDINGS: BRAIN: No intraparenchymal hemorrhage, mass effect nor midline shift. Severe ventriculomegaly on the basis of parenchymal brain volume loss, relative sparing of the occipital  lobes. Patchy supratentorial white matter hypodensities. Old LEFT basal ganglia lacunar infarct. No acute large vascular territory infarcts. No abnormal extra-axial fluid collections. Subcentimeter bony excrescence frontotemporal inner table. VASCULAR: Mild calcific atherosclerosis of the carotid siphons. SKULL: No skull fracture. No significant scalp soft tissue swelling. SINUSES/ORBITS: RIGHT maxillary and RIGHT sphenoid sinusitis.The included ocular globes and orbital contents are non-suspicious. OTHER: None. IMPRESSION: 1. No acute intracranial process. 2. Worsening severe atrophy in a distribution associated with neurodegenerative disorders. Electronically Signed   By: Elon Alas M.D.   On: 03/29/2017 21:17   Ct Abdomen Pelvis W Contrast  Result Date: 03/30/2017 CLINICAL DATA:  83 year old with abdominal pain and vomiting. EXAM: CT ABDOMEN AND PELVIS WITH CONTRAST TECHNIQUE: Multidetector CT imaging of the abdomen and pelvis was performed using the standard protocol following bolus administration of intravenous contrast. CONTRAST:  162m ISOVUE-300 IOPAMIDOL (ISOVUE-300) INJECTION 61% COMPARISON:  CT 10/29/2006 FINDINGS: Lower chest: Patchy and ground-glass opacities in both dependent lower lobes. Trace left pleural effusion. Hepatobiliary: No focal hepatic lesion. Layering gallstones without gallbladder inflammation or distention. No biliary dilatation. Pancreas: Parenchymal atrophy. No ductal dilatation or inflammation. Spleen: Normal in size without focal abnormality. Adrenals/Urinary Tract: No adrenal nodule. Thinning of bilateral renal parenchyma. No  hydronephrosis. Extrarenal pelvis on the right. Mild motion artifact through the upper kidneys. Symmetric excretion on delayed phase imaging. Tiny low-density lesion in the left kidney too small to characterize. Urinary bladder is physiologically distended, no wall thickening. Stomach/Bowel: Small hiatal hernia. Stomach is nondistended. No small bowel dilatation, inflammation or obstruction. Appendix not confidently visualized. The ascending, transverse, and descending colon are decompressed. Diverticulosis of the aim distal descending and sigmoid colon without acute inflammation. Moderate sigmoid colonic stool borderline with stool distending the rectum, rectal dimension of 7.1 cm. No pericolonic inflammation. Vascular/Lymphatic: Aortic atherosclerosis. Focal ectasia of the infrarenal aorta maximal dimension 2.8 cm. Aortic and iliac tortuosity. No adenopathy. Reproductive: Status post hysterectomy. No adnexal masses. Other: No free air, free fluid, or intra-abdominal fluid collection. Postsurgical changes the anterior abdominal wound. Musculoskeletal: Bony under mineralization. Levo scoliotic curvature of the lumbar spine. Moderate to severe compression fractures of L3 and L4, new from remote prior exam common but appear chronic. IMPRESSION: 1. Stool distending the rectum concerning for fecal impaction. No bowel inflammation. No obstruction. 2. Cholelithiasis without findings of gallbladder inflammation. 3. Sigmoid colonic diverticulosis. 4. Aortic Atherosclerosis (ICD10-I70.0). Ectatic abdominal aorta at risk for aneurysm development. Recommend followup by ultrasound in 5 years. This recommendation follows ACR consensus guidelines: White Paper of the ACR Incidental Findings Committee II on Vascular Findings. J Am Coll Radiol 2013; 10:789-794. Electronically Signed   By: MJeb LeveringM.D.   On: 03/30/2017 00:54   Dg Chest Portable 1 View  Result Date: 03/29/2017 CLINICAL DATA:  Altered mental status with  vomiting EXAM: PORTABLE CHEST 1 VIEW COMPARISON:  11/10/2014 FINDINGS: The right apex is obscured by artifact. Mildly low lung volumes. Minimal atelectasis left base. Stable cardiomediastinal silhouette with atherosclerosis. IMPRESSION: Obscured right upper lobe by artifact. Minimal atelectasis at the left phase Electronically Signed   By: KDonavan FoilM.D.   On: 03/29/2017 22:07        Scheduled Meds: . enoxaparin (LOVENOX) injection  30 mg Subcutaneous Q24H  . folic acid  1 mg Oral Daily  . iopamidol      . senna  1 tablet Oral BID  . thiamine  100 mg Oral Daily   Continuous Infusions: . sodium chloride 100 mL/hr at 03/30/17 0527  .  piperacillin-tazobactam (ZOSYN)  IV 3.375 g (03/30/17 0511)  . potassium chloride 10 mEq (03/30/17 0648)  . [START ON 03/31/2017] vancomycin       LOS: 0 days    Time spent: 40 minutes    WOODS, Geraldo Docker, MD [facility] Pager [phone]   If 7PM-7AM, please contact night-coverage [email or web address] Password [facility] 03/30/2017, 7:38 AM

## 2017-03-30 NOTE — Consult Note (Signed)
Consultation Note Date: 03/30/2017   Patient Name: Stephanie Bruce  DOB: 09/06/1924  MRN: [id number]  Age / Sex: 83 y.o., female  PCP: Orpah Melter, MD Referring Physician: Allie Bossier, MD  Reason for Consultation: Establishing goals of care  HPI/Patient Profile: 83 y.o.femalewith advanced Dementia, hx of CVA, HTN, COPD, Chronic Contractures.  Clinical Assessment and Goals of Care: Spoke with Stephanie Bruce, son and POA. He states he and his brother have been taking care of [name] at home for 10 years. Upon addressing goals of care, Stephanie Bruce states he has been asked about that many times over the last 2 days and things have not changed. "I am going to fight for her every step of the way." When asked about a living will Stephanie Bruce stated she would not want one, that they have discussed these decisions many times and that "she would fight for me and I'll fight for her". He indicated he wants to proceed with aggressive treatment, and full code status. He states that if she were to ever to need to be placed on a ventilator he would make decisions from there.  Offer made for home palliative care, and discussed that this was not hospice, he declined and said "I have a good support network, I have a nurse that lives next door, so I don't need them."    Son Stephanie Bruce, [place]. [phone]    SUMMARY OF RECOMMENDATIONS   Full code, aggressive treatment.   Code Status/Advance Care Planning:  Full code  Prognosis:   Unable to determine. Variable, depends on oral intake, recurrent infections  Discharge Planning: Per PT/OT, primary team.      Primary Diagnoses: Present on Admission: . Dementia . Essential hypertension . Hypokalemia . Malnutrition of moderate degree ([place]) . Leukocytosis . Vomiting alone . Severe sepsis ([place]) . Elevated lactic acid level . Acute encephalopathy . SIRS (systemic inflammatory  response syndrome) (HCC) . Dyspnea . Sepsis ([place]) . Obstipation   I have reviewed the medical record, interviewed the patient and family, and examined the patient. The following aspects are pertinent.  Past Medical History:  Diagnosis Date  . Arthritis   . BRBPR (bright red blood per rectum) 10/19/2016  . Bronchitis    History of  . Bruises easily   . Cancer ([place])    skin   . Constipation   . COPD (chronic obstructive pulmonary disease) ([place])   . H/O: GI bleed   . History of fractured rib   . History of kidney stones   . History of syncope   . Hyperlipidemia   . Hypertension   . Stroke [facility])    Social History   Social History  . Marital status: Widowed    Spouse name: N/A  . Number of children: N/A  . Years of education: N/A   Social History Main Topics  . Smoking status: Never Smoker  . Smokeless tobacco: Former Systems developer    Types: Snuff    Quit date: 05/15/2010  . Alcohol use No  .  Drug use: No  . Sexual activity: No   Other Topics Concern  . None   Social History Narrative  . None   Family History  Problem Relation Age of Onset  . Cancer Mother   . Diabetes Mother   . Hyperlipidemia Mother   . Hypertension Mother   . Diabetes type II Mother   . Malignant hyperthermia Mother   . Cancer Father   . Hypertension Father   . Pneumonia Father   . Stroke Father    Scheduled Meds: . enoxaparin (LOVENOX) injection  30 mg Subcutaneous Q24H  . folic acid  1 mg Oral Daily  . mouth rinse  15 mL Mouth Rinse BID  . senna  1 tablet Oral BID  . thiamine  100 mg Oral Daily   Continuous Infusions: . sodium chloride    . piperacillin-tazobactam (ZOSYN)  IV 3.375 g (03/30/17 1251)  . sodium chloride 1,000 mL (03/30/17 1123)  . [START ON 03/31/2017] vancomycin     PRN Meds:.bisacodyl, ondansetron **OR** ondansetron (ZOFRAN) IV, polyethylene glycol Medications Prior to Admission:  Prior to Admission medications   Medication Sig Start Date End Date Taking?  Authorizing Provider  aspirin 81 MG tablet Take 1 tablet (81 mg total) by mouth daily. RESUME AFTER 1 WEEK. 10/21/16  Yes Bonnielee Haff, MD  docusate sodium (COLACE) 100 MG capsule Take 1 capsule (100 mg total) by mouth 2 (two) times daily. Patient taking differently: Take 100 mg by mouth 2 (two) times daily as needed for mild constipation.  10/21/16  Yes Bonnielee Haff, MD  feeding supplement, ENSURE ENLIVE, (ENSURE ENLIVE) LIQD Take 237 mLs by mouth 2 (two) times daily between meals. 11/12/14  Yes Regalado, Belkys A, MD  Multiple Vitamins-Minerals (MULTIVITAMIN WITH MINERALS) tablet Take 0.5 tablets by mouth daily.   Yes [provider]  polyethylene glycol (MIRALAX) packet Take 17 g by mouth daily. Patient taking differently: Take 17 g by mouth daily as needed for mild constipation.  10/21/16  Yes Bonnielee Haff, MD   Allergies  Allergen Reactions  . Zolpidem Tartrate     Pt becomes comatose.  . Banana Other (See Comments)    weak  . Iron Other (See Comments)    Pt turn black   Review of Systems  Unable to perform ROS   Physical Exam  Constitutional:  Thin and frail  HENT:  Head: Normocephalic.  Pulmonary/Chest: Effort normal.  Neurological:  Eyes open, non-verbal.    Vital Signs: BP (!) 155/67 (BP Location: Left Arm)   Pulse 100   Temp (!) 97.5 F (36.4 C) (Oral)   Resp (!) 29   Ht 5\' 2"  (1.575 m)   Wt 49.3 kg (108 lb 11 oz)   SpO2 92%   BMI 19.88 kg/m  Pain Assessment: PAINAD       SpO2: SpO2: 92 % O2 Device:SpO2: 92 % O2 Flow Rate: .O2 Flow Rate (L/min): 2 L/min  IO: Intake/output summary:  Intake/Output Summary (Last 24 hours) at 03/30/17 1304 Last data filed at 03/30/17 0400  Gross per 24 hour  Intake           3532.5 ml  Output                0 ml  Net           3532.5 ml    LBM: Last BM Date: 03/30/17 Baseline Weight: Weight: 51.3 kg (113 lb 1.5 oz) Most recent weight: Weight: 49.3 kg (108 lb 11 oz)  Palliative Assessment/Data:30%  at best     Time In: 12:20 Time Out: 1:30 Time Total: 1 hour 20 minutes Greater than 50%  of this time was spent counseling and coordinating care related to the above assessment and plan.  Signed by: Asencion Gowda, NP   Please contact Palliative Medicine Team phone at [phone] for questions and concerns.  For individual provider: See Shea Evans

## 2017-03-30 NOTE — Progress Notes (Signed)
CRITICAL VALUE ALERT  Critical Value:  Lactic Acid 3.5  Date & Time Notied:  03/30/17 1007  Provider Notified: Sherral Hammers, MD  Orders Received/Actions taken: RN awaiting orders. RN will continue to monitor.

## 2017-03-30 NOTE — ED Notes (Signed)
Pt to CT with transport at this time

## 2017-03-30 NOTE — Progress Notes (Signed)
CRITICAL VALUE ALERT  Critical Value:  Troponin 0.03  Date & Time Notied:  03/30/17 1025  Provider Notified: Sherral Hammers, MD  Orders Received/Actions taken: RN awaiting orders. RN will continue to monitor.

## 2017-03-30 NOTE — ED Notes (Signed)
Doutova MD at bedside. 
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
## 2017-03-30 NOTE — ED Notes (Signed)
Report called to [facility]. Room changed to #3. Being cleaned currently. RN will call when it is ready.

## 2017-03-30 NOTE — Progress Notes (Signed)
CRITICAL VALUE ALERT  Critical Value:  Lactic Acid 3.8  Date & Time Notied:  03/30/17  8315  Provider Notified: Dr. Myna Hidalgo  Orders Received/Actions taken: IVF rate increased to 194mL/h

## 2017-03-30 NOTE — Progress Notes (Signed)
Initial Nutrition Assessment  DOCUMENTATION CODES:   Not applicable  INTERVENTION:   -Ensure Enlive po BID, each supplement provides 350 kcal and 20 grams of protein -MVI daily  NUTRITION DIAGNOSIS:   Increased nutrient needs related to wound healing as evidenced by estimated needs.  GOAL:   Patient will meet greater than or equal to 90% of their needs  MONITOR:   PO intake, Supplement acceptance, Labs, Weight trends, Skin, I & O's  REASON FOR ASSESSMENT:   Consult  (malnutrition)  ASSESSMENT:   Stephanie Bruce is a 83 y.o. female with medical history significant of advanced dementia, history of stroke, essential hypertension, chronic contractures. , COPD and HTN, hyperlipidemia admitted with for Dehydration, acute encephalopathy SIRS found to be obstipated.  Pt admitted with obstipation.   Pt underwent BSE this AM, recommending continue dysphagia 1 diet with thin liquids.   Pt unable to participate in interview. Spoke with pt son at bedside, who reveals that pt consumes 3 meals per day. She consumes a pureed diet at home and is fed by her sons. Breakfasts consists of applesauce and Boost supplement, Lunch and Dinner: pureed meat, starch, and vegetables. Pt tolerates crushed medications with applesauce.   Per son, no noticeable wt loss. Pt has experienced a 4.4% wt loss over the past 5 months, which is not significant for time frame.   Pt with chronic wounds on back; pt is followed by the wound center and takes MVI daily.   Nutrition-Focused physical exam completed. Findings are mild to moderate fat depletion, mild to moderate muscle depletion, and no edema. Suspect fat and muscle wasting multi-factorial (advanced age, limited mobility). Unable to identify malnutrition. Albumin has a half-life of 21 days and is strongly affected by stress response and inflammatory process, therefore, do not expect to see an improvement in this lab value during acute hospitalization. When a  patient presents with low albumin, it is likely skewed due to the acute inflammatory response. Note that low albumin is no longer used to diagnose malnutrition; [place] uses the new malnutrition guidelines published by the American Society for Parenteral and Enteral Nutrition (A.S.P.E.N.) and the Academy of Nutrition and Dietetics (AND).    Discussed importance of continued good meal and supplement intake to promote healing. Pt son amenable to continue Ensure and MVI supplements.   Labs reviewed.   Diet Order:  DIET - DYS 1 Room service appropriate? Yes; Fluid consistency: Thin  Skin:  Wound (see comment) (stage I back, stage III back)  Last BM:  03/30/17  Height:   Ht Readings from Last 1 Encounters:  03/30/17 5\' 2"  (1.575 m)    Weight:   Wt Readings from Last 1 Encounters:  03/30/17 108 lb 11 oz (49.3 kg)    Ideal Body Weight:  50 kg  BMI:  Body mass index is 19.88 kg/m.  Estimated Nutritional Needs:   Kcal:  1250-1450  Protein:  55-70 grams  Fluid:  1.2-1.4 L  EDUCATION NEEDS:   Education needs addressed  Giovonnie Trettel A. Jimmye Norman, RD, LDN, CDE Pager: [phone] After hours Pager: [phone]

## 2017-03-30 NOTE — Progress Notes (Signed)
Pharmacy Antibiotic Note  Stephanie Bruce is a 83 y.o. female admitted on 03/29/2017 with sepsis.  Pharmacy has been consulted for Vancomycin and Zosyn dosing.   Vanc 1 gm IV x 1 given ~10pm last night, with plan for q48h dosing.   Serum creatinine trended down after fluids.   Tmax 102.1 last night, WBC up to 33.3.  Plan:  Vancomycin 750 mg IV q24hrs to begin tonight.  Continue Zosyn 3.375 gm IV q8hrs (each over 4 hours)  Follow renal function, culture data, progress.  Target vanc troughs 15-20 mcg/ml  Will plan to check Vanc trough level at steady state.  Height: 5\' 2"  (157.5 cm) Weight: 108 lb 11 oz (49.3 kg) IBW/kg (Calculated) : 50.1  Temp (24hrs), Avg:99.8 F (37.7 C), Min:97.5 F (36.4 C), Max:102.1 F (38.9 C)   Recent Labs Lab 03/29/17 2047 03/29/17 2124 03/30/17 0010 03/30/17 0309 03/30/17 0908  WBC 26.5*  --   --   --  33.3*  CREATININE 0.97  --   --   --  0.75  LATICACIDVEN  --  6.86* 4.4* 3.8* 3.5*    Estimated Creatinine Clearance: 36.4 mL/min (by C-G formula based on SCr of 0.75 mg/dL).    Allergies  Allergen Reactions  . Zolpidem Tartrate     Pt becomes comatose.  . Banana Other (See Comments)    weak  . Iron Other (See Comments)    Pt turn black    Antimicrobials this admission:  Vancomycin  9/26 >>  Zosyn 9/26 >>  Dose adjustments this admission:  9/27: empiric maintenance dose changed from 1gm IV q48h ->750 mg IV q24h  Microbiology results:  9/26 Influenza panel - negative  9/26 respiratory panel - negative  9/26 blood x 2 - sent  9/26 MRSA PCR negative  Thank you for allowing pharmacy to be a part of this patient's care.  Arty Baumgartner, [place] Pager: [phone] 03/30/2017 2:30 PM

## 2017-03-31 ENCOUNTER — Inpatient Hospital Stay (HOSPITAL_COMMUNITY): Payer: Medicare Other

## 2017-03-31 ENCOUNTER — Other Ambulatory Visit (HOSPITAL_COMMUNITY): Payer: Medicare Other

## 2017-03-31 DIAGNOSIS — E876 Hypokalemia: Secondary | ICD-10-CM

## 2017-03-31 DIAGNOSIS — R6510 Systemic inflammatory response syndrome (SIRS) of non-infectious origin without acute organ dysfunction: Secondary | ICD-10-CM

## 2017-03-31 DIAGNOSIS — F0150 Vascular dementia without behavioral disturbance: Secondary | ICD-10-CM

## 2017-03-31 DIAGNOSIS — G9340 Encephalopathy, unspecified: Secondary | ICD-10-CM

## 2017-03-31 DIAGNOSIS — D72829 Elevated white blood cell count, unspecified: Secondary | ICD-10-CM

## 2017-03-31 LAB — LACTIC ACID, PLASMA: LACTIC ACID, VENOUS: 1.6 mmol/L (ref 0.5–1.9)

## 2017-03-31 MED ORDER — MORPHINE SULFATE (PF) 2 MG/ML IV SOLN
1.0000 mg | INTRAVENOUS | Status: DC | PRN
  Administered 2017-03-31 – 2017-04-02 (×6): 1 mg via INTRAVENOUS
  Filled 2017-03-31 (×7): qty 1

## 2017-03-31 MED ORDER — METOPROLOL TARTRATE 25 MG/10 ML ORAL SUSPENSION
12.5000 mg | Freq: Two times a day (BID) | ORAL | Status: DC
  Administered 2017-03-31: 12.5 mg via ORAL
  Filled 2017-03-31: qty 10

## 2017-03-31 MED ORDER — HYDRALAZINE HCL 20 MG/ML IJ SOLN
10.0000 mg | Freq: Once | INTRAMUSCULAR | Status: AC
  Administered 2017-03-31: 10 mg via INTRAVENOUS

## 2017-03-31 MED ORDER — METOPROLOL TARTRATE 5 MG/5ML IV SOLN
2.5000 mg | Freq: Four times a day (QID) | INTRAVENOUS | Status: DC | PRN
  Administered 2017-03-31 – 2017-04-01 (×2): 2.5 mg via INTRAVENOUS
  Filled 2017-03-31 (×2): qty 5

## 2017-03-31 MED ORDER — HYDRALAZINE HCL 20 MG/ML IJ SOLN
INTRAMUSCULAR | Status: AC
  Administered 2017-03-31: 10 mg via INTRAVENOUS
  Filled 2017-03-31: qty 1

## 2017-03-31 MED ORDER — SENNOSIDES-DOCUSATE SODIUM 8.6-50 MG PO TABS
1.0000 | ORAL_TABLET | Freq: Two times a day (BID) | ORAL | Status: DC
  Administered 2017-04-02: 1 via ORAL
  Filled 2017-03-31 (×2): qty 1

## 2017-03-31 MED ORDER — MAGNESIUM SULFATE 2 GM/50ML IV SOLN: 2.0000 g | Freq: Once | INTRAVENOUS | Status: DC

## 2017-03-31 MED ORDER — METOPROLOL TARTRATE 25 MG/10 ML ORAL SUSPENSION: 12.5000 mg | Freq: Two times a day (BID) | ORAL | Status: DC

## 2017-03-31 MED ORDER — METOPROLOL TARTRATE 25 MG/10 ML ORAL SUSPENSION
12.5000 mg | Freq: Two times a day (BID) | ORAL | Status: DC
  Administered 2017-04-02: 12.5 mg via ORAL
  Filled 2017-03-31 (×3): qty 10

## 2017-03-31 MED ORDER — SODIUM CHLORIDE 0.9 % IV BOLUS (SEPSIS)
250.0000 mL | Freq: Once | INTRAVENOUS | Status: AC
  Administered 2017-03-31: 250 mL via INTRAVENOUS

## 2017-03-31 MED ORDER — LABETALOL HCL 5 MG/ML IV SOLN
20.0000 mg | Freq: Once | INTRAVENOUS | Status: AC
  Administered 2017-03-31: 20 mg via INTRAVENOUS
  Filled 2017-03-31: qty 4

## 2017-03-31 MED ORDER — LABETALOL HCL 5 MG/ML IV SOLN
0.5000 mg/min | INTRAVENOUS | Status: DC
  Filled 2017-03-31: qty 100

## 2017-03-31 NOTE — Progress Notes (Signed)
[place] TEAM 1 - Stepdown/ICU TEAM  Stephanie Bruce  YIF:[id number] DOB: 22-Aug-1924 DOA: 03/29/2017 PCP: Orpah Melter, MD    Brief Narrative:  83yo Fw/ a Hx of advanced Dementia, CVA, HTN, syncope, COPD, HLD, Nephrolithiasis, diverticulosis w/ GIB, and Chronic Contractures who presented w/ AMS and vomiting x5.    Subjective: The patient is noncommunicative. I spoke with one of her sons at the bedside.  He states that she appears to be doing much better.  There is no evidence of acute respiratory distress nor does the patient appear to be in pain.  Assessment & Plan:  Severe Sepsis v/s SIRS w/ Lactic acidosis Lactic acidosis resolved w/ volume expansion - presently positive approximately 5 L since admission - respiratory virus panel unremarkable - influenza screen negative - UA not consistent with UTI - blood cultures negative 2 thus far  -I am suspicious this may be SIRS due to profound DH instead of sepsis - repeat CXR in AM post volume resuscitation - cont empiric abx for now w/ low threshold to d/c if f/u CXR stable   Acute encephalopathy on chronic severe Dementia  at baseline non-verbal, bedbound - family feels she is back to her baseline mental state  Essential HTN Avoid overcorrection in setting of advanced age and vascular disease  Obstipation Corrected  Nutrition Cleared for D1 diet w/ thin liquids per SLP - consumes "anything you offer her" per family and RN - cancel Cortrak   Hypokalemia Corrected with supplementation  Hypomagnesemia Supplement further today and recheck in a.m.  Goals of Care Sons insist on continuing w/ FULL CODE    DVT prophylaxis: Lovenox Code Status: FULL CODE Family Communication: spoke with son at bedside  Disposition Plan: potential for discharge in 24-48 hours  Consultants:  Palliative Care  Procedures: none  Antimicrobials:  Zosyn 9/26 > Vancomycin 9/26 > 9/27  Objective: Blood pressure (!) 159/77, pulse 90,  temperature 99.8 F (37.7 C), temperature source Axillary, resp. rate (!) 25, height 5\' 2"  (1.575 m), weight 49.3 kg (108 lb 11 oz), SpO2 95 %.  Intake/Output Summary (Last 24 hours) at 03/31/17 1336 Last data filed at 03/31/17 0500  Gross per 24 hour  Intake             1737 ml  Output              400 ml  Net             1337 ml   Filed Weights   03/29/17 2132 03/30/17 0148  Weight: 51.3 kg (113 lb 1.5 oz) 49.3 kg (108 lb 11 oz)    Examination: General: No acute respiratory distress evident  Lungs: Clear to auscultation bilaterally without wheezes or crackles Cardiovascular: Regular rate and rhythm without murmur gallop or rub normal S1 and S2 Abdomen: Nontender, nondistended, soft, bowel sounds positive, no rebound, no ascites, no appreciable mass Extremities: No significant cyanosis, clubbing, or edema bilateral lower extremities  CBC:  Recent Labs Lab 03/29/17 2047 03/30/17 0908  WBC 26.5* 33.3*  NEUTROABS 24.6* 29.3*  HGB 12.3 11.1*  HCT 38.0 34.3*  MCV 86.8 86.4  PLT 289 867   Basic Metabolic Panel:  Recent Labs Lab 03/29/17 2047 03/30/17 0309 03/30/17 0908  NA 135  --  138  K 3.0*  --  3.8  CL 102  --  106  CO2 19*  --  22  GLUCOSE 227*  --  144*  BUN 7  --  6  CREATININE  0.97  --  0.75  CALCIUM 8.5*  --  8.0*  MG  --  1.7 1.6*  PHOS  --  2.8 2.2*   GFR: Estimated Creatinine Clearance: 36.4 mL/min (by C-G formula based on SCr of 0.75 mg/dL).  Liver Function Tests:  Recent Labs Lab 03/29/17 2047 03/30/17 0908  AST 75* 54*  ALT 42 32  ALKPHOS 105 79  BILITOT 0.9 1.2  PROT 6.8 5.7*  ALBUMIN 3.1* 2.6*    Recent Labs Lab 03/29/17 2047  AMMONIA 37*    Coagulation Profile:  Recent Labs Lab 03/30/17 0309  INR 1.04    Cardiac Enzymes:  Recent Labs Lab 03/30/17 0000 03/30/17 0908 03/30/17 1457  TROPONINI <0.03 0.03* 0.03*    HbA1C: Hgb A1c MFr Bld  Date/Time Value Ref Range Status  03/30/2017 03:09 AM 5.7 (H) 4.8 - 5.6 %  Final    Comment:    (NOTE) Pre diabetes:          5.7%-6.4% Diabetes:              >6.4% Glycemic control for   <7.0% adults with diabetes   11/11/2014 07:17 AM 5.7 (H) 4.8 - 5.6 % Final    Comment:    (NOTE)         Pre-diabetes: 5.7 - 6.4         Diabetes: >6.4         Glycemic control for adults with diabetes: <7.0      Recent Results (from the past 240 hour(s))  Blood Culture (routine x 2)     Status: None (Preliminary result)   Collection Time: 03/29/17  9:38 PM  Result Value Ref Range Status   Specimen Description BLOOD RIGHT WRIST  Final   Special Requests   Final    BOTTLES DRAWN AEROBIC AND ANAEROBIC Blood Culture adequate volume   Culture NO GROWTH 2 DAYS  Final   Report Status PENDING  Incomplete  Blood Culture (routine x 2)     Status: None (Preliminary result)   Collection Time: 03/29/17  9:45 PM  Result Value Ref Range Status   Specimen Description BLOOD RIGHT HAND  Final   Special Requests   Final    BOTTLES DRAWN AEROBIC AND ANAEROBIC Blood Culture adequate volume   Culture NO GROWTH 2 DAYS  Final   Report Status PENDING  Incomplete  Respiratory Panel by PCR     Status: None   Collection Time: 03/30/17  2:00 AM  Result Value Ref Range Status   Adenovirus NOT DETECTED NOT DETECTED Final   Coronavirus 229E NOT DETECTED NOT DETECTED Final   Coronavirus HKU1 NOT DETECTED NOT DETECTED Final   Coronavirus NL63 NOT DETECTED NOT DETECTED Final   Coronavirus OC43 NOT DETECTED NOT DETECTED Final   Metapneumovirus NOT DETECTED NOT DETECTED Final   Rhinovirus / Enterovirus NOT DETECTED NOT DETECTED Final   Influenza A NOT DETECTED NOT DETECTED Final   Influenza B NOT DETECTED NOT DETECTED Final   Parainfluenza Virus 1 NOT DETECTED NOT DETECTED Final   Parainfluenza Virus 2 NOT DETECTED NOT DETECTED Final   Parainfluenza Virus 3 NOT DETECTED NOT DETECTED Final   Parainfluenza Virus 4 NOT DETECTED NOT DETECTED Final   Respiratory Syncytial Virus NOT DETECTED NOT  DETECTED Final   Bordetella pertussis NOT DETECTED NOT DETECTED Final   Chlamydophila pneumoniae NOT DETECTED NOT DETECTED Final   Mycoplasma pneumoniae NOT DETECTED NOT DETECTED Final  MRSA PCR Screening     Status: None  Collection Time: 03/30/17  2:00 AM  Result Value Ref Range Status   MRSA by PCR NEGATIVE NEGATIVE Final    Comment:        The GeneXpert MRSA Assay (FDA approved for NASAL specimens only), is one component of a comprehensive MRSA colonization surveillance program. It is not intended to diagnose MRSA infection nor to guide or monitor treatment for MRSA infections.      Scheduled Meds: . enoxaparin (LOVENOX) injection  30 mg Subcutaneous Q24H  . feeding supplement (ENSURE ENLIVE)  237 mL Oral TID BM  . folic acid  1 mg Oral Daily  . mouth rinse  15 mL Mouth Rinse BID  . multivitamin with minerals  1 tablet Oral Daily  . senna  1 tablet Oral BID  . thiamine  100 mg Oral Daily     LOS: 1 day   Cherene Altes, MD [facility] Office  [phone] Pager - Text Page per Amion as per below:  On-Call/Text Page:      [email or web address]      password [email or web address]  If 7PM-7AM, please contact night-coverage [email or web address] Password [email or web address] 03/31/2017, 1:36 PM

## 2017-03-31 NOTE — Progress Notes (Signed)
Palliative Medicine RN Note:  Follow up for symptoms. Two sons at bedside. They report that she is at her baseline and ready to go home, and that they feel she is very comfortable.  Discussed pt in team rounds. Family is firm in their decisions regarding [place]. PMT will sign off at this time, as there is no longer a place for [place] to engage with the family.  Marjie Skiff Glema Takaki, RN, BSN, [facility] 03/31/2017 1:33 PM Cell [phone] 8:00-4:00 Monday-Friday Office [phone]

## 2017-03-31 NOTE — Progress Notes (Signed)
Event Note:  Initially called around 7:30 pm with report of BP 200/100's after IV metoprolol which worsened after OTO hydralazine (SBP 241/120). Ordered labetalol and IV Morphine for pain at 8:15 pm  Called by Rapid Response at approximately 8:45 pm that patient experiencing dyspnea and hypoxia, breathing 30+ /min.  CXR ordered  On arrival at bedside patient BP improved (164/73) and appeared more comfortable per report. Oxygen saturation 93% on 4L [place]. Still tachypneic - 36 / min.  CXR: Small right pleural effusion with associated atelectasis or pneumonia.  Physical Exam (focused): Vitals:   03/31/17 2001 03/31/17 2031 03/31/17 2100 03/31/17 2105  BP: (!) 232/104 (!) 164/73 118/72   Pulse: (!) 118 95 98 84  Resp: 19 (!) 27 (!) 36 20  Temp:    100.1 F (37.8 C)  TempSrc:    Rectal  SpO2: 92% 93% 95% 96%  Weight:      Height:         General: Responsive to commands. Nonverbal Heart:  RRR Lungs: clear bilateral diminished in bases.  IMPRESSION AND PLAN:  Severe Sepsis vs SIRS with lactic acidosis See Dr. Garen Lah noted from today. Repeat chext xray as above. Current temp 100.1. On Zosyn.  HTN BP initially improved after labetalol to 165/73. However  continued to drop. Holding PM oral metoprolol and giving 250 cc NS bolus.  Hypoxia Current oxygen sat 96% on 4L [place] - ABG pending. Continue to monitor for now.

## 2017-03-31 NOTE — Significant Event (Signed)
Rapid Response Event Note  Overview:Called by RN d/t hypertenstion Time Called: 2010 Arrival Time: 2015 Event Type: Respiratory  Initial Focused Assessment: Pt laying in bed, warm and diaphoretic, RR-40 with accessory muscles use.  T-100.1(R), HR-100 ST, SBP-220s. SpO2-90% on 2L [place]. Pt nonverbal at baseline. Lungs clear bilaterally.  Interventions: Several BP meds and morphine given prior to my arrival.  FiO2 increased to 4L [place] and pt repostioned in bed. Walden Field, NP to bedside. PCXR ordered-small R pleural effusion, R basiliar atelectasis or pneumonia.   ABG ordered.    Update-2100-Pt RR-32, SpO2-97% on 4L [place].  Pt appears more comfortable. BP-118/72.  Plan of Care (if not transferred):  Continue to monitor pt.  Call RRT if further assistance needed.   Event Summary: Name of Physician Notified: Walden Field, NP at  (PTA RRT)    at    Outcome: Stayed in room and stabalized  Event End Time: 2100  [place]

## 2017-03-31 NOTE — Care Management Note (Signed)
Case Management Note  Patient Details  Name: Stephanie Bruce MRN: [id number] Date of Birth: 1924/09/27  Subjective/Objective:                 Patient from home. Two sons live with her and provide total care. She has 24/ 7 support and supervision. Has hospital bed, WC tub bench. Currently w supplimental O2. Will need to watch for O2 needs at DC. Deny need for additional DME. Son at bedside states they have used [facility] in the past and would like to use them again after DC.    Action/Plan:  Needs orders for [facility] RN HHA and SW and referral to [facility] at time of DC  Expected Discharge Date:                  Expected Discharge Plan:  [place]  In-House Referral:     Discharge planning Services  CM Consult  Post Acute Care Choice:    Choice offered to:     DME Arranged:    DME Agency:     HH Arranged:    [place] Agency:     Status of Service:  In process, will continue to follow  If discussed at Long Length of Stay Meetings, dates discussed:    Additional Comments:  Carles Collet, RN 03/31/2017, 2:49 PM

## 2017-03-31 NOTE — Progress Notes (Addendum)
Cortrak Tube Team Note:  Consult received to place a Cortrak feeding tube.   On entering room, patient being fed an Ensure via syringe by son. He states he is not sure why a feeding tube was ordered as "she eats everything you give her". When asked how many of those the patient is drinking a day, he stated "as many as you give her".   RN also reports good intake. She says the patient is taking her meds and drinks everything that she gives her.   Dietitian note corroborates history of good PO intake with patient consuming 3 meals a day. Per chart, patient with insignificant wt loss, but son reports no noticeable wt loss. Per chart, pt is down <5 lbs since April.   Will attempt to reach MD to discuss and will addend note when can reach - MD notified via text page, Cortrak consult discontinued.   Burtis Junes RD, LDN, CNSC Clinical Nutrition Pager: [phone] 03/31/2017 10:58 AM

## 2017-03-31 NOTE — Progress Notes (Signed)
ANTIBIOTIC CONSULT NOTE  Pharmacy Consult for Zosyn Indication: sepsis  Allergies  Allergen Reactions  . Zolpidem Tartrate     Pt becomes comatose.  . Banana Other (See Comments)    weak  . Iron Other (See Comments)    Pt turn black    Patient Measurements: Height: 5\' 2"  (157.5 cm) Weight: 108 lb 11 oz (49.3 kg) IBW/kg (Calculated) : 50.1 Adjusted Body Weight:    Vital Signs: Temp: 99.8 F (37.7 C) (09/28 1142) Temp Source: Axillary (09/28 1142) BP: 159/77 (09/28 1142) Pulse Rate: 90 (09/28 1142) Intake/Output from previous day: 09/27 0701 - 09/28 0700 In: 6659 [P.O.:237; I.V.:1250; IV Piggyback:250] Out: 400 [Urine:400] Intake/Output from this shift: No intake/output data recorded.  Labs:  Recent Labs  03/29/17 2047 03/30/17 0908  WBC 26.5* 33.3*  HGB 12.3 11.1*  PLT 289 175  CREATININE 0.97 0.75   Estimated Creatinine Clearance: 36.4 mL/min (by C-G formula based on SCr of 0.75 mg/dL). No results for input(s): VANCOTROUGH, VANCOPEAK, VANCORANDOM, GENTTROUGH, GENTPEAK, GENTRANDOM, TOBRATROUGH, TOBRAPEAK, TOBRARND, AMIKACINPEAK, AMIKACINTROU, AMIKACIN in the last 72 hours.   Microbiology:   Medical History: Past Medical History:  Diagnosis Date  . Arthritis   . BRBPR (bright red blood per rectum) 10/19/2016  . Bronchitis    History of  . Bruises easily   . Cancer ([place])    skin   . Constipation   . COPD (chronic obstructive pulmonary disease) ([place])   . H/O: GI bleed   . History of fractured rib   . History of kidney stones   . History of syncope   . Hyperlipidemia   . Hypertension   . Stroke [facility])     Assessment: CC/HPI: 83 y.o. female admitted on 03/29/2017 with AMS, shaking and episodes of emesis. Starting broad spectrum abx for rule out sepsis.   ID: day #3 Zosyn for rule out sepsis, rare bacti on UA Tmax down to 99.8, WBC up 33.3 not rechecked today, LA 6.86>>3.5>1.6, PCT 7.83  Antimicrobials this admission:  Vancomycin  9/26 >>  Zosyn  9/26 >>   Dose adjustments this admission: N/A   Microbiology results:  9/26 Influenza panel - negative  9/26 respiratory panel - negative  9/26 blood x 2 - NGTD  9/26 MRSA PCR negative  Goal of Therapy:  Eradication of infection  Plan:  Zosyn 3.375g IV q 8hr. Dose ok down to a CrCl of 20. Pharmacy will sign off. Please reconsult for further dosing assitance.   Niani Mourer S. Alford Highland, PharmD, BCPS Clinical Staff Pharmacist Pager [phone]  Eilene Ghazi Stillinger 03/31/2017,2:17 PM

## 2017-03-31 NOTE — Progress Notes (Signed)
Patient's blood pressure spiked to the 210s/100s. Scheduled metoprolol 12.5 mg oral given and Q6 PRN 2.5 mg IV given. Pressures still 213/102. Night MD paged. Awaiting new orders. RN will continue to monitor.

## 2017-03-31 NOTE — Progress Notes (Signed)
Pt BP: 232/104 (142). pt diaphoretic, tachy, gasping for air, RR 38, HR 120. Pt non-verbal, but blink twice when asked if she was in pain. On call night paged. Rapid paged. RN will continue to monitor.

## 2017-03-31 NOTE — Progress Notes (Signed)
Nutrition Follow-up  DOCUMENTATION CODES:   Not applicable  INTERVENTION:   -Continue Ensure Enlive po TID, each supplement provides 350 kcal and 20 grams of protein -Continue MVI daily -In the event cortrak tube is needed, recommend:  Initiate Jevity 1.2 @ 20 ml/hr via cortrak tube and increase by 10 ml every 4 hours to goal rate of 50 ml/hr.   Tube feeding regimen provides 1440 kcal (100% of needs), 67 grams of protein, and 968 ml of H2O.   NUTRITION DIAGNOSIS:   Increased nutrient needs related to wound healing as evidenced by estimated needs.  Ongoing  GOAL:   Patient will meet greater than or equal to 90% of their needs  Progressing  MONITOR:   PO intake, Supplement acceptance, Labs, Weight trends, Skin, I & O's  REASON FOR ASSESSMENT:   Consult  (malnutrition)  ASSESSMENT:   Stephanie Bruce is a 83 y.o. female with medical history significant of advanced dementia, history of stroke, essential hypertension, chronic contractures. , COPD and HTN, hyperlipidemia admitted with for Dehydration, acute encephalopathy SIRS found to be obstipated.  RD received consult for assessment of nutritional status/ needs and TF initiation and management. RD completed full assessment on pt yesterday (03/30/17)- see note for further details.   Case discussed with RN, who reports that pt is tolerating meals, medications, and supplements well. Family friend also confirmed that pt was eating well PTA, with no noticeable wt loss; pt is small framed at baseline and typically weighs around 110#. Sons at bedside are very attentive and assist with feeding. Both RN and RD on cortrak service today confirm that pt and family refused feeding tube placement. Cortrak service has also paged ordering MD to discuss case.   Suspect intake is adequate at this time, but will provide TF recommendations in the event that pt is unable to accept PO intake and family reconsiders TF. Palliative care team following;  family still desires full code.   Labs reviewed: Phos: 2.2, Mg: 1.6  Diet Order:  DIET - DYS 1 Room service appropriate? Yes; Fluid consistency: Thin  Skin:  Wound (see comment) (stage I back, stage III back)  Last BM:  03/30/17  Height:   Ht Readings from Last 1 Encounters:  03/30/17 5\' 2"  (1.575 m)    Weight:   Wt Readings from Last 1 Encounters:  03/30/17 108 lb 11 oz (49.3 kg)    Ideal Body Weight:  50 kg  BMI:  Body mass index is 19.88 kg/m.  Estimated Nutritional Needs:   Kcal:  1250-1450  Protein:  55-70 grams  Fluid:  1.2-1.4 L  EDUCATION NEEDS:   Education needs addressed  Stephanie Bruce A. Jimmye Norman, RD, LDN, CDE Pager: [phone] After hours Pager: [phone]

## 2017-04-01 ENCOUNTER — Inpatient Hospital Stay (HOSPITAL_COMMUNITY): Payer: Medicare Other

## 2017-04-01 DIAGNOSIS — R0600 Dyspnea, unspecified: Secondary | ICD-10-CM

## 2017-04-01 DIAGNOSIS — J690 Pneumonitis due to inhalation of food and vomit: Principal | ICD-10-CM

## 2017-04-01 DIAGNOSIS — R0902 Hypoxemia: Secondary | ICD-10-CM

## 2017-04-01 LAB — COMPREHENSIVE METABOLIC PANEL
ALBUMIN: 2.3 g/dL — AB (ref 3.5–5.0)
ALT: 40 U/L (ref 14–54)
AST: 43 U/L — AB (ref 15–41)
Alkaline Phosphatase: 78 U/L (ref 38–126)
Anion gap: 7 (ref 5–15)
BILIRUBIN TOTAL: 0.4 mg/dL (ref 0.3–1.2)
CHLORIDE: 108 mmol/L (ref 101–111)
CO2: 25 mmol/L (ref 22–32)
Calcium: 7.8 mg/dL — ABNORMAL LOW (ref 8.9–10.3)
Creatinine, Ser: 0.59 mg/dL (ref 0.44–1.00)
GFR calc Af Amer: >60 mL/min (ref >60)
GFR calc non Af Amer: >60 mL/min (ref >60)
Glucose, Bld: 168 mg/dL — ABNORMAL HIGH (ref 65–99)
POTASSIUM: 2.9 mmol/L — AB (ref 3.5–5.1)
Sodium: 140 mmol/L (ref 135–145)
Total Protein: 5.4 g/dL — ABNORMAL LOW (ref 6.5–8.1)

## 2017-04-01 LAB — CBC
HCT: 32 % — ABNORMAL LOW (ref 36.0–46.0)
Hemoglobin: 10.4 g/dL — ABNORMAL LOW (ref 12.0–15.0)
MCH: 28.2 pg (ref 26.0–34.0)
MCHC: 32.5 g/dL (ref 30.0–36.0)
MCV: 86.7 fL (ref 78.0–100.0)
PLATELETS: 221 10*3/uL (ref 150–400)
RBC: 3.69 MIL/uL — ABNORMAL LOW (ref 3.87–5.11)
RDW: 16.1 % — AB (ref 11.5–15.5)
WBC: 15 10*3/uL — ABNORMAL HIGH (ref 4.0–10.5)

## 2017-04-01 LAB — ECHOCARDIOGRAM COMPLETE
HEIGHTINCHES: 62 in
WEIGHTICAEL: 1738.99 [oz_av]

## 2017-04-01 LAB — MAGNESIUM: MAGNESIUM: 1.7 mg/dL (ref 1.7–2.4)

## 2017-04-01 MED ORDER — POTASSIUM CHLORIDE 10 MEQ/100ML IV SOLN
INTRAVENOUS | Status: AC
Start: 2017-04-01 — End: 2017-04-01
  Administered 2017-04-01: 10 meq via INTRAVENOUS
  Filled 2017-04-01: qty 300

## 2017-04-01 MED ORDER — POTASSIUM CHLORIDE 10 MEQ/100ML IV SOLN
10.0000 meq | INTRAVENOUS | Status: DC
  Administered 2017-04-01 (×4): 10 meq via INTRAVENOUS
  Filled 2017-04-01 (×3): qty 100

## 2017-04-01 MED ORDER — POTASSIUM CHLORIDE 20 MEQ/15ML (10%) PO SOLN
40.0000 meq | Freq: Three times a day (TID) | ORAL | Status: DC
  Administered 2017-04-01 – 2017-04-02 (×3): 40 meq via ORAL
  Filled 2017-04-01 (×3): qty 30

## 2017-04-01 NOTE — Progress Notes (Signed)
RN called to room per son; RR 74s; HR 140s; pt in obvious resp distress w/gasps for air and accessory muscle use.  Son reports he had just previously given her liquids per syringe approx 3-5 mins prior to episode.  RN medicates w/Morphine IVP per PRN orders and early dose of IV Metoprolol 2.5mg .  Son states this is the same type resp distress patient has had on two prior occasions, just prior to coming to ED and late last night, both time following consumption of food/fluids.  RN encourages sons not to give patient any PO intake at this time and to await physician decision as to PO intake in light of questionable aspiration.  Patient does not exhibit signs of aspiration while consuming food/fluids but approx 5 minutes post-consumption.  Will continue to monitor closely.

## 2017-04-01 NOTE — Progress Notes (Signed)
  Echocardiogram 2D Echocardiogram has been performed.  Stephanie Bruce 04/01/2017, 4:25 PM

## 2017-04-01 NOTE — Progress Notes (Signed)
[facility] TEAM 1 - Stepdown/ICU TEAM  Stephanie Bruce  PIR:[id number] DOB: 11/22/1924 DOA: 03/29/2017 PCP: Orpah Melter, MD    Brief Narrative:  82yo Fw/ a Hx of advanced Dementia, CVA, HTN, syncope, COPD, HLD, Nephrolithiasis, diverticulosis w/ GIB, and Chronic Contractures who presented w/ AMS and vomiting x5.    Subjective: The patient suffered episodes last night of acute distress.  Discussion with the patient's sons and investigation of these events suggested they appear to be related to feeding suggesting silent aspiration.  At the present time she is resting comfortably and quietly.  Assessment & Plan:  SIRS w/ Lactic acidosis Lactic acidosis resolved w/ volume expansion - presently positive approximately 6.6 L since admission - respiratory virus panel unremarkable - influenza screen negative - UA not consistent with UTI - blood cultures negative 2 thus far - I am suspicious this may be SIRS due to profound DH instead of sepsis - repeat CXR w/o findings to suggest PNA - d/c abx coverage   Suspected intermittent silent aspiration Discussed treatment options at length with signs - explained that dysphagia often develops in late stage dementia and is essentially untreatable - advised that continuing to feed her with a syringe but in smaller volumes over a longer period of time may decrease her risk but that nothing we can do would eliminate the risk of her aspirating - the sons ask about a feeding tube - I suggested that it would not be a solution to this problem but that this could be discussed further if we do not have success with her current treatment plan  Acute encephalopathy on chronic severe Dementia  at baseline non-verbal, bedbound - family feels she is back to her baseline mental state  Essential HTN Avoid overcorrection in setting of advanced age and vascular disease - episodes of severe hypertension likely related to acute distress of silent  aspiration  Obstipation Corrected  Hypokalemia Continue to supplement and follow  Hypomagnesemia Corrected to a normal range  Goals of Care Sons insist on continuing w/ FULL CODE   DVT prophylaxis: Lovenox Code Status: FULL CODE Family Communication: spoke with sons at bedside  Disposition Plan: potential for discharge in 24 hours  Consultants:  Palliative Care  Procedures: none  Antimicrobials:  Zosyn 9/26 > 9/29 Vancomycin 9/26 > 9/27  Objective: Blood pressure 138/72, pulse 86, temperature 99 F (37.2 C), temperature source Axillary, resp. rate (!) 23, height 5\' 2"  (1.575 m), weight 49.3 kg (108 lb 11 oz), SpO2 93 %.  Intake/Output Summary (Last 24 hours) at 04/01/17 1602 Last data filed at 04/01/17 0720  Gross per 24 hour  Intake          2361.67 ml  Output              200 ml  Net          2161.67 ml   Filed Weights   03/29/17 2132 03/30/17 0148  Weight: 51.3 kg (113 lb 1.5 oz) 49.3 kg (108 lb 11 oz)    Examination: General: No acute respiratory distress at time of my visit  Lungs: Clear to auscultation bilaterally but w/ poor air movement in bases  Cardiovascular: RRR w/o M  Abdomen: Nondistended, soft, bowel sounds positive Extremities: No edema bilateral lower extremities  CBC:  Recent Labs Lab 03/29/17 2047 03/30/17 0908 04/01/17 0224  WBC 26.5* 33.3* 15.0*  NEUTROABS 24.6* 29.3*  --   HGB 12.3 11.1* 10.4*  HCT 38.0 34.3* 32.0*  MCV 86.8 86.4  86.7  PLT 289 175 629   Basic Metabolic Panel:  Recent Labs Lab 03/29/17 2047 03/30/17 0309 03/30/17 0908 04/01/17 0224  NA 135  --  138 140  K 3.0*  --  3.8 2.9*  CL 102  --  106 108  CO2 19*  --  22 25  GLUCOSE 227*  --  144* 168*  BUN 7  --  6 <5*  CREATININE 0.97  --  0.75 0.59  CALCIUM 8.5*  --  8.0* 7.8*  MG  --  1.7 1.6* 1.7  PHOS  --  2.8 2.2*  --    GFR: Estimated Creatinine Clearance: 36.4 mL/min (by C-G formula based on SCr of 0.59 mg/dL).  Liver Function  Tests:  Recent Labs Lab 03/29/17 2047 03/30/17 0908 04/01/17 0224  AST 75* 54* 43*  ALT 42 32 40  ALKPHOS 105 79 78  BILITOT 0.9 1.2 0.4  PROT 6.8 5.7* 5.4*  ALBUMIN 3.1* 2.6* 2.3*    Recent Labs Lab 03/29/17 2047  AMMONIA 37*    Coagulation Profile:  Recent Labs Lab 03/30/17 0309  INR 1.04    Cardiac Enzymes:  Recent Labs Lab 03/30/17 0000 03/30/17 0908 03/30/17 1457  TROPONINI <0.03 0.03* 0.03*    HbA1C: Hgb A1c MFr Bld  Date/Time Value Ref Range Status  03/30/2017 03:09 AM 5.7 (H) 4.8 - 5.6 % Final    Comment:    (NOTE) Pre diabetes:          5.7%-6.4% Diabetes:              >6.4% Glycemic control for   <7.0% adults with diabetes   11/11/2014 07:17 AM 5.7 (H) 4.8 - 5.6 % Final    Comment:    (NOTE)         Pre-diabetes: 5.7 - 6.4         Diabetes: >6.4         Glycemic control for adults with diabetes: <7.0      Recent Results (from the past 240 hour(s))  Blood Culture (routine x 2)     Status: None (Preliminary result)   Collection Time: 03/29/17  9:38 PM  Result Value Ref Range Status   Specimen Description BLOOD RIGHT WRIST  Final   Special Requests   Final    BOTTLES DRAWN AEROBIC AND ANAEROBIC Blood Culture adequate volume   Culture NO GROWTH 3 DAYS  Final   Report Status PENDING  Incomplete  Blood Culture (routine x 2)     Status: None (Preliminary result)   Collection Time: 03/29/17  9:45 PM  Result Value Ref Range Status   Specimen Description BLOOD RIGHT HAND  Final   Special Requests   Final    BOTTLES DRAWN AEROBIC AND ANAEROBIC Blood Culture adequate volume   Culture NO GROWTH 3 DAYS  Final   Report Status PENDING  Incomplete  Respiratory Panel by PCR     Status: None   Collection Time: 03/30/17  2:00 AM  Result Value Ref Range Status   Adenovirus NOT DETECTED NOT DETECTED Final   Coronavirus 229E NOT DETECTED NOT DETECTED Final   Coronavirus HKU1 NOT DETECTED NOT DETECTED Final   Coronavirus NL63 NOT DETECTED NOT  DETECTED Final   Coronavirus OC43 NOT DETECTED NOT DETECTED Final   Metapneumovirus NOT DETECTED NOT DETECTED Final   Rhinovirus / Enterovirus NOT DETECTED NOT DETECTED Final   Influenza A NOT DETECTED NOT DETECTED Final   Influenza B NOT DETECTED NOT DETECTED Final  Parainfluenza Virus 1 NOT DETECTED NOT DETECTED Final   Parainfluenza Virus 2 NOT DETECTED NOT DETECTED Final   Parainfluenza Virus 3 NOT DETECTED NOT DETECTED Final   Parainfluenza Virus 4 NOT DETECTED NOT DETECTED Final   Respiratory Syncytial Virus NOT DETECTED NOT DETECTED Final   Bordetella pertussis NOT DETECTED NOT DETECTED Final   Chlamydophila pneumoniae NOT DETECTED NOT DETECTED Final   Mycoplasma pneumoniae NOT DETECTED NOT DETECTED Final  MRSA PCR Screening     Status: None   Collection Time: 03/30/17  2:00 AM  Result Value Ref Range Status   MRSA by PCR NEGATIVE NEGATIVE Final    Comment:        The GeneXpert MRSA Assay (FDA approved for NASAL specimens only), is one component of a comprehensive MRSA colonization surveillance program. It is not intended to diagnose MRSA infection nor to guide or monitor treatment for MRSA infections.      Scheduled Meds: . enoxaparin (LOVENOX) injection  30 mg Subcutaneous Q24H  . feeding supplement (ENSURE ENLIVE)  237 mL Oral TID BM  . folic acid  1 mg Oral Daily  . mouth rinse  15 mL Mouth Rinse BID  . metoprolol tartrate  12.5 mg Oral BID  . multivitamin with minerals  1 tablet Oral Daily  . senna-docusate  1 tablet Oral BID  . thiamine  100 mg Oral Daily     LOS: 2 days   Cherene Altes, MD [facility] Office  [phone] Pager - Text Page per Amion as per below:  On-Call/Text Page:      [email or web address]      password [email or web address]  If 7PM-7AM, please contact night-coverage [email or web address] Password [email or web address] 04/01/2017, 4:02 PM

## 2017-04-02 DIAGNOSIS — R7989 Other specified abnormal findings of blood chemistry: Secondary | ICD-10-CM

## 2017-04-02 DIAGNOSIS — K5900 Constipation, unspecified: Secondary | ICD-10-CM

## 2017-04-02 LAB — BASIC METABOLIC PANEL
ANION GAP: 6 (ref 5–15)
BUN: <5 mg/dL — ABNORMAL LOW (ref 6–20)
CHLORIDE: 106 mmol/L (ref 101–111)
CO2: 27 mmol/L (ref 22–32)
CREATININE: 0.57 mg/dL (ref 0.44–1.00)
Calcium: 8.1 mg/dL — ABNORMAL LOW (ref 8.9–10.3)
GFR calc non Af Amer: >60 mL/min (ref >60)
Glucose, Bld: 111 mg/dL — ABNORMAL HIGH (ref 65–99)
POTASSIUM: 3.9 mmol/L (ref 3.5–5.1)
Sodium: 139 mmol/L (ref 135–145)

## 2017-04-02 MED ORDER — ENSURE ENLIVE PO LIQD: 1.0000 | Freq: Three times a day (TID) | ORAL | 0 refills | Status: AC

## 2017-04-02 MED ORDER — DOCUSATE SODIUM 100 MG PO CAPS: 100.0000 mg | ORAL_CAPSULE | Freq: Two times a day (BID) | ORAL | 0 refills | Status: AC

## 2017-04-02 MED ORDER — POLYETHYLENE GLYCOL 3350 17 G PO PACK: 17.0000 g | PACK | Freq: Two times a day (BID) | ORAL | 0 refills | Status: AC

## 2017-04-02 MED ORDER — SENNOSIDES-DOCUSATE SODIUM 8.6-50 MG PO TABS: 1.0000 | ORAL_TABLET | Freq: Two times a day (BID) | ORAL | 0 refills | Status: AC

## 2017-04-02 NOTE — Discharge Instructions (Signed)
As we discussed, feed Ms. Rick in your usual method but change to 5 smaller meals a day instead of 3, and go slowly.     Aspiration Pneumonia Aspiration pneumonia is an infection in your lungs. It occurs when food, liquid, or stomach contents (vomit) are inhaled (aspirated) into your lungs. When these things get into your lungs, swelling (inflammation) and infection can occur. This can make it difficult for you to breathe. Aspiration pneumonia is a serious condition and can be life threatening. What increases the risk? Aspiration pneumonia is more likely to occur when a person's cough (gag) reflex or ability to swallow has been decreased. Some things that can do this include:  Having a brain injury or disease, such as stroke, seizures, Parkinson's disease, dementia, or amyotrophic lateral sclerosis (ALS).  Being given general anesthetic for procedures.  Being in a coma (unconscious).  Having a narrowing of the tube that carries food to the stomach (esophagus).  Drinking too much alcohol. If a person passes out and vomits, vomit can be swallowed into the lungs.  Taking certain medicines, such as tranquilizers or sedatives.  What are the signs or symptoms?  Coughing after swallowing food or liquids.  Breathing problems, such as wheezing or shortness of breath.  Bluish skin. This can be caused by lack of oxygen.  Coughing up food or mucus. The mucus might contain blood, greenish material, or yellowish-white fluid (pus).  Fever.  Chest pain.  Being more tired than usual (fatigue).  Sweating more than usual.  Bad breath. How is this diagnosed? A physical exam will be done. During the exam, the health care provider will listen to your lungs with a stethoscope to check for:  Crackling sounds in the lungs.  Decreased breath sounds.  A rapid heartbeat.  Various tests may be ordered. These may include:  Chest X-ray.  CT scan.  Swallowing study. This test looks at how food  is swallowed and whether it goes into your breathing tube (trachea) or food pipe (esophagus).  Sputum culture. Saliva and mucus (sputum) are collected from the lungs or the tubes that carry air to the lungs (bronchi). The sputum is then tested for bacteria.  Bronchoscopy. This test uses a flexible tube (bronchoscope) to see inside the lungs.  How is this treated? Treatment will usually include antibiotic medicines. Other medicines may also be used to reduce fever or pain. You may need to be treated in the hospital. In the hospital, your breathing will be carefully monitored. Depending on how well you are breathing, you may need to be given oxygen, or you may need breathing support from a breathing machine (ventilator). For people who fail a swallowing study, a feeding tube might be placed in the stomach, or they may be asked to avoid certain food textures or liquids when they eat. Follow these instructions at home:  Carefully follow any special eating instructions you were given, such as avoiding certain food textures or thickening liquids. This reduces the risk of developing aspiration pneumonia again.  Only take over-the-counter or prescription medicines as directed by your health care provider. Follow the directions carefully.  If you were prescribed antibiotics, take them as directed. Finish them even if you start to feel better.  Rest as instructed by your health care provider.  Keep all follow-up appointments with your health care provider. Contact a health care provider if:  You develop worsening shortness of breath, wheezing, or difficulty breathing.  You develop a fever.  You have chest pain.  This information is not intended to replace advice given to you by your health care provider. Make sure you discuss any questions you have with your health care provider. Document Released: 04/17/2009 Document Revised: 12/02/2015 Document Reviewed: 12/06/2012 Elsevier Interactive Patient  Education  2017 [employer]     Constipation, Adult Constipation is when a person:  Poops (has a bowel movement) fewer times in a week than normal.  Has a hard time pooping.  Has poop that is dry, hard, or bigger than normal.  Follow these instructions at home: Eating and drinking   Eat foods that have a lot of fiber, such as: ? Fresh fruits and vegetables. ? Whole grains. ? Beans.  Eat less of foods that are high in fat, low in fiber, or overly processed, such as: ? [place] fries. ? Hamburgers. ? Cookies. ? Candy. ? Soda.  Drink enough fluid to keep your pee (urine) clear or pale yellow. General instructions  Exercise regularly or as told by your doctor.  Go to the restroom when you feel like you need to poop. Do not hold it in.  Take over-the-counter and prescription medicines only as told by your doctor. These include any fiber supplements.  Do pelvic floor retraining exercises, such as: ? Doing deep breathing while relaxing your lower belly (abdomen). ? Relaxing your pelvic floor while pooping.  Watch your condition for any changes.  Keep all follow-up visits as told by your doctor. This is important. Contact a doctor if:  You have pain that gets worse.  You have a fever.  You have not pooped for 4 days.  You throw up (vomit).  You are not hungry.  You lose weight.  You are bleeding from the anus.  You have thin, pencil-like poop (stool). Get help right away if:  You have a fever, and your symptoms suddenly get worse.  You leak poop or have blood in your poop.  Your belly feels hard or bigger than normal (is bloated).  You have very bad belly pain.  You feel dizzy or you faint. This information is not intended to replace advice given to you by your health care provider. Make sure you discuss any questions you have with your health care provider. Document Released: 12/07/2007 Document Revised: 01/08/2016 Document Reviewed:  12/09/2015 Elsevier Interactive Patient Education  2017 [employer].

## 2017-04-02 NOTE — Discharge Summary (Signed)
DISCHARGE SUMMARY  Stephanie Bruce  MR#: [id number]  DOB:1925/04/21  Date of Admission: 03/29/2017 Date of Discharge: 04/02/2017  Attending Physician:Ripken Rekowski T  Patient's ACZ:[id number], Stephanie Main, MD  Consults:  none  Disposition: D/C home to care of sons   Follow-up Appts: Follow-up Information    Orpah Melter, MD. Schedule an appointment as soon as possible for a visit in 1 week(s).   Specialty:  Family Medicine Contact information: [place] [address] [phone]           Discharge Diagnoses: SIRS w/ Lactic acidosis Suspected intermittent silent aspiration w/ aspiration pneumonitis  Acute encephalopathy on chronic severe Dementia  Essential HTN Obstipation Hypokalemia Hypomagnesemia  Initial presentation: 83yo Fw/ a Hxof advanced Dementia, CVA, HTN, syncope, COPD, HLD, Nephrolithiasis, diverticulosis w/ GIB, and Chronic Contractures who presented w/ AMS and vomiting x5.    Hospital Course:  SIRS w/ Lactic acidosis Lactic acidosis resolved w/ volume expansion - positive approximately 6.6 L for admission - respiratory virus panel unremarkable - influenza screen negative - UA not consistent with UTI - blood cultures negative 2 - it was felt this was SIRS due to profound DH and pneumonitis instead of sepsis - repeat CXR w/o findings to suggest PNA   Suspected intermittent silent aspiration w/ aspiration pneumonitis  During her hospital stay the patient had a number of acute episodes of respiratory decline following closely around periods of feeding - clinically it appeared evident that these were related to silent aspiration - the MD discussed treatment options at length with both sons - explained that dysphagia often develops in late stage dementia and is untreatable - advised that continuing to feed her with a syringe but in smaller volumes over a longer period of time may decrease her risk but that nothing we can do would eliminate  the risk of her aspirating - the sons asked about a feeding tube - I suggested that it would not be a solution to this problem but that this could be discussed further if we do not have success with her current treatment plan - the decision has been made to return the pt home on smaller more frequent modified meals (they feed her w/ a syringe)   Acute encephalopathy on chronic severe Dementia  at baseline non-verbal, bedbound - family feels she is back to her baseline mental state at time of d/c   Essential HTN Avoid overcorrection in setting of advanced age and vascular disease - episodes of severe hypertension likely related to acute distress of silent aspiration - clinically stable at time of d/c home   Obstipation Corrected - potentially contributing to aspiration w/ episodes of vomiting and nausea decreasing tolerance of oral diet   Hypokalemia Corrected to 3.9 with supplementation  Hypomagnesemia Corrected to 1.7 with supplementation  Goals of Care Sons insist on continuing w/ FULL CODE   Allergies as of 04/02/2017      Reactions   Zolpidem Tartrate    Pt becomes comatose.   Banana Other (See Comments)   weak   Iron Other (See Comments)   Pt turn black      Medication List    TAKE these medications   aspirin 81 MG tablet Take 1 tablet (81 mg total) by mouth daily. RESUME AFTER 1 WEEK.   docusate sodium 100 MG capsule Commonly known as:  COLACE Take 1 capsule (100 mg total) by mouth 2 (two) times daily. What changed:  when to take this  reasons to take  this   feeding supplement (ENSURE ENLIVE) Liqd Take 237 mLs by mouth 3 (three) times daily between meals. What changed:  when to take this   multivitamin with minerals tablet Take 0.5 tablets by mouth daily.   polyethylene glycol packet Commonly known as:  MIRALAX Take 17 g by mouth 2 (two) times daily. What changed:  when to take this   senna-docusate 8.6-50 MG tablet Commonly known as:   Senokot-S Take 1 tablet by mouth 2 (two) times daily.            Discharge Care Instructions        Start     Ordered   04/02/17 0000  docusate sodium (COLACE) 100 MG capsule  2 times daily     04/02/17 1054   04/02/17 0000  feeding supplement, ENSURE ENLIVE, (ENSURE ENLIVE) LIQD  3 times daily between meals     04/02/17 1054   04/02/17 0000  polyethylene glycol (MIRALAX) packet  2 times daily     04/02/17 1054   04/02/17 0000  senna-docusate (SENOKOT-S) 8.6-50 MG tablet  2 times daily     04/02/17 1054      Day of Discharge BP (!) 152/88 (BP Location: Right Arm)   Pulse 85   Temp 100 F (37.8 C) (Axillary)   Resp (!) 21   Ht 5\' 2"  (1.575 m)   Wt 49.3 kg (108 lb 11 oz)   SpO2 96%   BMI 19.88 kg/m   Physical Exam: General: No acute respiratory distress evident - pt is non-communicative  Lungs: Clear to auscultation bilaterally without wheezes or crackles Cardiovascular: Regular rate and rhythm without murmur Abdomen: Nondistended, soft, bowel sounds positive, no rebound, no ascites Extremities: No significant cyanosis, clubbing, or edema bilateral lower extremities - legs contracted up against pelvis   Basic Metabolic Panel:  Recent Labs Lab 03/29/17 2047 03/30/17 0309 03/30/17 0908 04/01/17 0224 04/02/17 0231  NA 135  --  138 140 139  K 3.0*  --  3.8 2.9* 3.9  CL 102  --  106 108 106  CO2 19*  --  22 25 27   GLUCOSE 227*  --  144* 168* 111*  BUN 7  --  6 <5* <5*  CREATININE 0.97  --  0.75 0.59 0.57  CALCIUM 8.5*  --  8.0* 7.8* 8.1*  MG  --  1.7 1.6* 1.7  --   PHOS  --  2.8 2.2*  --   --     Liver Function Tests:  Recent Labs Lab 03/29/17 2047 03/30/17 0908 04/01/17 0224  AST 75* 54* 43*  ALT 42 32 40  ALKPHOS 105 79 78  BILITOT 0.9 1.2 0.4  PROT 6.8 5.7* 5.4*  ALBUMIN 3.1* 2.6* 2.3*    Recent Labs Lab 03/29/17 2047  AMMONIA 37*   Coags:  Recent Labs Lab 03/30/17 0309  INR 1.04    CBC:  Recent Labs Lab 03/29/17 2047  03/30/17 0908 04/01/17 0224  WBC 26.5* 33.3* 15.0*  NEUTROABS 24.6* 29.3*  --   HGB 12.3 11.1* 10.4*  HCT 38.0 34.3* 32.0*  MCV 86.8 86.4 86.7  PLT 289 175 221    Cardiac Enzymes:  Recent Labs Lab 03/30/17 0000 03/30/17 0908 03/30/17 1457  TROPONINI <0.03 0.03* 0.03*    Recent Results (from the past 240 hour(s))  Blood Culture (routine x 2)     Status: None (Preliminary result)   Collection Time: 03/29/17  9:38 PM  Result Value Ref Range Status  Specimen Description BLOOD RIGHT WRIST  Final   Special Requests   Final    BOTTLES DRAWN AEROBIC AND ANAEROBIC Blood Culture adequate volume   Culture NO GROWTH 3 DAYS  Final   Report Status PENDING  Incomplete  Blood Culture (routine x 2)     Status: None (Preliminary result)   Collection Time: 03/29/17  9:45 PM  Result Value Ref Range Status   Specimen Description BLOOD RIGHT HAND  Final   Special Requests   Final    BOTTLES DRAWN AEROBIC AND ANAEROBIC Blood Culture adequate volume   Culture NO GROWTH 3 DAYS  Final   Report Status PENDING  Incomplete  Respiratory Panel by PCR     Status: None   Collection Time: 03/30/17  2:00 AM  Result Value Ref Range Status   Adenovirus NOT DETECTED NOT DETECTED Final   Coronavirus 229E NOT DETECTED NOT DETECTED Final   Coronavirus HKU1 NOT DETECTED NOT DETECTED Final   Coronavirus NL63 NOT DETECTED NOT DETECTED Final   Coronavirus OC43 NOT DETECTED NOT DETECTED Final   Metapneumovirus NOT DETECTED NOT DETECTED Final   Rhinovirus / Enterovirus NOT DETECTED NOT DETECTED Final   Influenza A NOT DETECTED NOT DETECTED Final   Influenza B NOT DETECTED NOT DETECTED Final   Parainfluenza Virus 1 NOT DETECTED NOT DETECTED Final   Parainfluenza Virus 2 NOT DETECTED NOT DETECTED Final   Parainfluenza Virus 3 NOT DETECTED NOT DETECTED Final   Parainfluenza Virus 4 NOT DETECTED NOT DETECTED Final   Respiratory Syncytial Virus NOT DETECTED NOT DETECTED Final   Bordetella pertussis NOT DETECTED  NOT DETECTED Final   Chlamydophila pneumoniae NOT DETECTED NOT DETECTED Final   Mycoplasma pneumoniae NOT DETECTED NOT DETECTED Final  MRSA PCR Screening     Status: None   Collection Time: 03/30/17  2:00 AM  Result Value Ref Range Status   MRSA by PCR NEGATIVE NEGATIVE Final    Comment:        The GeneXpert MRSA Assay (FDA approved for NASAL specimens only), is one component of a comprehensive MRSA colonization surveillance program. It is not intended to diagnose MRSA infection nor to guide or monitor treatment for MRSA infections.      Time spent in discharge (includes decision making & examination of pt): 35 minutes  04/02/2017, 10:57 AM   Cherene Altes, MD [facility] Office  [phone] Pager [phone]  On-Call/Text Page:      [email or web address]      password [facility]

## 2017-04-02 NOTE — Care Management Note (Signed)
Case Management Note Original Note Created Carles Collet, RN 03/31/2017, 2:49 PM   Patient Details  Name: Stephanie Bruce MRN: [id number] Date of Birth: [birth year]  Subjective/Objective:                 Patient from home. Two sons live with her and provide total care. She has 24/ 7 support and supervision. Has hospital bed, WC tub bench. Currently w supplimental O2. Will need to watch for O2 needs at DC. Deny need for additional DME. Son at bedside states they have used [facility] in the past and would like to use them again after DC.    Action/Plan:  Needs orders for [facility] RN HHA and SW and referral to [facility] at time of DC  Expected Discharge Date:  04/02/17               Expected Discharge Plan:  [place]  In-House Referral:  NA  Discharge planning Services  CM Consult  Post Acute Care Choice:  Home Health Choice offered to:  Adult Children  DME Arranged:  N/A DME Agency:     HH Arranged:  RN, Nurse's Aide, Social Work [employer] Agency:  [place]  Status of Service:  Completed, signed off  If discussed at [employer], dates discussed:    Discharge Disposition: home/home health   Additional Comments:  04/02/17- 1330- Marvetta Gibbons RN, CM- received call from bedside RN Tammy on [place]- pt for d/c home today with sons- they have requested [place] services with [facility]- have notified MD for orders- call made to [facility] for referral of [facility] services RN/aide/sw- [facility] accepted referral-  No other CM needs noted.   Dahlia Client [place], RN 04/02/2017, 1:40 PM [phone]

## 2017-04-03 LAB — CULTURE, BLOOD (ROUTINE X 2)
CULTURE: NO GROWTH
CULTURE: NO GROWTH
SPECIAL REQUESTS: ADEQUATE
SPECIAL REQUESTS: ADEQUATE

## 2017-04-03 LAB — POCT I-STAT 3, ART BLOOD GAS (G3+)
BICARBONATE: 21 mmol/L (ref 20.0–28.0)
O2 Saturation: 100 %
PH ART: 7.549 — AB (ref 7.350–7.450)
TCO2: 22 mmol/L (ref 22–32)
pCO2 arterial: 24.2 mmHg — ABNORMAL LOW (ref 32.0–48.0)
pO2, Arterial: 169 mmHg — ABNORMAL HIGH (ref 83.0–108.0)

## 2017-10-02 DEATH — deceased
